# Patient Record
Sex: Male | Born: 1940
Health system: Southern US, Community
[De-identification: ages and names within clinical notes are randomized; demographics above are authoritative.]

## PROBLEM LIST (undated history)

## (undated) DIAGNOSIS — I359 Nonrheumatic aortic valve disorder, unspecified: Secondary | ICD-10-CM

## (undated) DIAGNOSIS — R112 Nausea with vomiting, unspecified: Secondary | ICD-10-CM

## (undated) DIAGNOSIS — M1990 Unspecified osteoarthritis, unspecified site: Secondary | ICD-10-CM

## (undated) DIAGNOSIS — Z87442 Personal history of urinary calculi: Secondary | ICD-10-CM

## (undated) DIAGNOSIS — Z9289 Personal history of other medical treatment: Secondary | ICD-10-CM

## (undated) DIAGNOSIS — E039 Hypothyroidism, unspecified: Secondary | ICD-10-CM

## (undated) DIAGNOSIS — R519 Headache, unspecified: Secondary | ICD-10-CM

## (undated) DIAGNOSIS — J449 Chronic obstructive pulmonary disease, unspecified: Secondary | ICD-10-CM

## (undated) DIAGNOSIS — K219 Gastro-esophageal reflux disease without esophagitis: Secondary | ICD-10-CM

## (undated) DIAGNOSIS — R011 Cardiac murmur, unspecified: Secondary | ICD-10-CM

## (undated) DIAGNOSIS — I10 Essential (primary) hypertension: Secondary | ICD-10-CM

## (undated) DIAGNOSIS — R51 Headache: Secondary | ICD-10-CM

## (undated) DIAGNOSIS — N400 Enlarged prostate without lower urinary tract symptoms: Secondary | ICD-10-CM

## (undated) DIAGNOSIS — Z9889 Other specified postprocedural states: Secondary | ICD-10-CM

## (undated) DIAGNOSIS — J189 Pneumonia, unspecified organism: Secondary | ICD-10-CM

## (undated) DIAGNOSIS — R0600 Dyspnea, unspecified: Secondary | ICD-10-CM

## (undated) DIAGNOSIS — G4730 Sleep apnea, unspecified: Secondary | ICD-10-CM

## (undated) HISTORY — PX: JOINT REPLACEMENT: SHX530

## (undated) HISTORY — PX: TONSILLECTOMY: SUR1361

## (undated) HISTORY — PX: FRACTURE SURGERY: SHX138

## (undated) HISTORY — DX: Benign prostatic hyperplasia without lower urinary tract symptoms: N40.0

## (undated) HISTORY — PX: BACK SURGERY: SHX140

## (undated) HISTORY — DX: Hypothyroidism, unspecified: E03.9

## (undated) HISTORY — DX: Dyspnea, unspecified: R06.00

## (undated) HISTORY — DX: Chronic obstructive pulmonary disease, unspecified: J44.9

---
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
## 2010-09-23 ENCOUNTER — Ambulatory Visit: Payer: Self-pay | Admitting: Cardiovascular Disease

## 2010-09-27 ENCOUNTER — Telehealth: Payer: Self-pay | Admitting: Cardiovascular Disease

## 2011-01-14 NOTE — Progress Notes (Signed)
  Phone Note Outgoing Call   Call placed by: Dessie Coma  LPN,  September 27, 2010 9:45 AM Call placed to: Patient Summary of Call: LMVM-notified patient per Dr. Crista Luria was normal.

## 2011-01-14 NOTE — Progress Notes (Signed)
  Phone Note Call from Patient   Caller: Patient Summary of Call: T.C. from patient-since Lexiscan normal, does not feel the need to f/u with Dr. Kirke Corin.  Will f/u with PCP. Initial call taken by: Dessie Coma  LPN,  September 27, 2010 10:15 AM

## 2011-03-27 ENCOUNTER — Encounter: Payer: Self-pay | Admitting: Internal Medicine

## 2011-03-27 ENCOUNTER — Ambulatory Visit (INDEPENDENT_AMBULATORY_CARE_PROVIDER_SITE_OTHER): Payer: Medicare PPO | Admitting: Internal Medicine

## 2011-03-27 VITALS — BP 110/76 | HR 80 | Temp 98.3°F | Ht 70.0 in | Wt 208.0 lb

## 2011-03-27 DIAGNOSIS — J449 Chronic obstructive pulmonary disease, unspecified: Secondary | ICD-10-CM

## 2011-03-27 NOTE — Patient Instructions (Addendum)
Only use dulera 2 puffs every 12 hours if needed for short of breath - work on technique   Omeprazole 20 mg Take 30-60 min before first meal of the day and Pepcid ac 20 mg at bedtime  GERD (REFLUX)  is an extremely common cause of respiratory symptoms, many times with no significant heartburn at all.    It can be treated with medication, but also with lifestyle changes including avoidance of late meals, excessive alcohol, smoking cessation, and avoid fatty foods, chocolate, peppermint, colas, red wine, and acidic juices such as orange juice.  NO MINT OR MENTHOL PRODUCTS SO NO COUGH DROPS  USE SUGARLESS CANDY INSTEAD (jolley ranchers or Stover's)  NO OIL BASED VITAMINS   Please schedule a follow up office visit in 4 weeks, sooner if needed for PFT's only bring active medications

## 2011-03-27 NOTE — Progress Notes (Signed)
  Subjective:    Patient ID: Jeffrey Mahoney, male    DOB: 11-Aug-1941, 70 y.o.   MRN: [id number]  HPI  33 yowm quit smoking age 55 with no resp symptoms at all and  no need for respiratory meds referred to pulmonary clinic by Dr Marina Goodell for eval of difficult to manage airway symptoms? Etiology.  03/27/2011   Initial pulmonary office eval for gradual onset discomfort fluttering  In chest and a little breathing with hills with neg cardiac w/u in [place] but persisted x 2 months then 2 weeks prior to ov started felt like coming down with a cold with runny nose, scratchy throat, and cough/ sob  p exposure to sick family in [place] > in ER.   rx with prednisone and levaquin main symptom is fatigue,  Sleeping ok without resp flare cough and on but not      Review of Systems  Constitutional: Negative for fever, chills, activity change, appetite change and unexpected weight change.  HENT: Negative for congestion, sore throat, rhinorrhea, sneezing, trouble swallowing, dental problem, voice change and postnasal drip.   Eyes: Negative for visual disturbance.  Respiratory: Positive for cough and shortness of breath. Negative for choking.   Cardiovascular: Negative for chest pain and leg swelling.  Gastrointestinal: Negative for nausea, vomiting and abdominal pain.  Genitourinary: Negative for difficulty urinating.  Musculoskeletal: Negative for arthralgias.  Skin: Negative for rash.  Psychiatric/Behavioral: Negative for behavioral problems and confusion.       Objective:   Physical Exam    amb wm who  failed to answer a single question asked in a straightforward manner, tending to go off on tangents or answer questions with ambiguous medical terms or diagnoses and seemed frankly perplexed   when asked the same question more than once for clarification.  Wt 208 03/27/11  HEENT mild turbinate edema.  Oropharynx no thrush or excess pnd or cobblestoning.  No JVD or cervical adenopathy. Mild accessory muscle  hypertrophy. Trachea midline, nl thryroid. Chest was hyperinflated by percussion with diminished breath sounds and moderate increased exp time without wheeze. Hoover sign positive at mid inspiration. Regular rate and rhythm without murmur gallop or rub or increase P2 or edema.  Abd: no hsm, nl excursion. Ext warm without cyanosis or clubbing.     cxr 03/13/11 cxr ok Assessment & Plan:

## 2011-03-28 NOTE — Assessment & Plan Note (Addendum)
  When respiratory symptoms begin well after a patient reports complete smoking cessation,  it is very hard to "blame" COPD specifically or airways disorders in general  ie it doesn't make any more sense than hearing a  NASC[employer] driver wrecked his car while driving his kids to school or a surgeon sliced his hand off carving roast beef (it must be rare indeed!)     That is to say, once the high risk activity stops,  the symptoms should not suddenly erupt or markedly worsen.  If so, the differential diagnosis should include  obesity/deconditioning,  LPR/Reflux/Aspiration syndromes,  occult CHF, or  especially side effect of medications commonly used in this population.    In this case strongly suspect occult reflux.  Explained natural history of uri and why it's necessary in patients at risk to treat GERD aggressively  at least  short term   to reduce risk of evolving cyclical cough initially  triggered by epithelial injury and a heightened sensitivty to the effects of any upper airway irritants,  most importantly acid - related.  That is, the more sensitive the epithelium damaged for virus, the more the cough, the more the secondary reflux (especially in those prone to reflux) the more the irritation of the sensitive mucosa and so on in a cyclical pattern.   See instructions for specific recommendations which were reviewed directly with the patient who was given a copy with highlighter outlining the key components.   The proper method of use, as well as anticipated side effects, of this metered-dose inhaler are discussed and demonstrated to the patient.  He only achieved 50% effectiveness but probably doesn't really need inhalers at this point anyway

## 2011-04-07 ENCOUNTER — Telehealth: Payer: Self-pay | Admitting: Internal Medicine

## 2011-04-07 MED ORDER — OMEPRAZOLE 20 MG PO CPDR: DELAYED_RELEASE_CAPSULE | ORAL | Status: DC

## 2011-04-07 NOTE — Telephone Encounter (Signed)
Spoke with pt to verify msg. Rx for omeprazole was sent to pharm.

## 2011-04-29 NOTE — Letter (Signed)
September 23, 2010    Brent Bulla, MD  [address]  [place], [place]  [address]   RE:  Jeffrey Mahoney, Jeffrey Mahoney  MRN:  [id number]  /  DOB:  06-06-1941   Dear Dr. Marina Goodell:   Thank you for referring Jeffrey Mahoney for further cardiac evaluation.  As you  are aware, this is a pleasant 70 year old gentleman with the following  problem list:  1. Hypothyroidism.  2. Benign prostatic hypertrophy.  3. Ankle injury since he was a child.   Jeffrey Mahoney is here today for evaluation of chest pain and dyspnea.  He has  no previous cardiac history.  He tells me that he received a flu shot  about a week ago.  After that he started having some runny nose  associated with dyspnea.  He started having discomfort in his back which  is worse with cough and deep breath as well as with sneezing.  He had no  chest pain but noticed that he is getting out of breath easier with  activities.  There have been no palpitations, dizziness, syncope or  presyncope.  The discomfort in his back is aggravated by certain  movements.  It can happen at rest most of the time.  It does not seem to  be related to activities.  He went today for evaluation and had an EKG  done which was slightly abnormal with some minimal ST depression in the  inferior as well as V5 and V6.  Thus he was sent for urgent evaluation.  He is currently not having any discomfort.   PAST MEDICAL HISTORY:  Is as outlined above.   MEDICATIONS:  1. Avodart 50 mg once daily.  2. Synthroid 112 mcg once daily.  3. Vitamin E once daily.  4. Vitamin B6.   ALLERGIES:  No known drug allergies.   SOCIAL HISTORY:  He quit smoking in 1989.  He drinks 1 or 2 beers 2or 3  times a week.  He does not exercise regularly.  He works as an  Personnel officer.   FAMILY HISTORY:  His uncle died at the age of 55 from myocardial  infarction.  His mother died of stroke complications in her 11s but had  her first stroke when she was 85.   REVIEW OF SYSTEMS:  This is remarkable for dyspnea,  allergies,  arthritis, back discomfort as described above.  A full review of systems  was performed and is otherwise negative.   PHYSICAL EXAMINATION:  GENERAL:  The patient is pleasant and appears to  be in no acute distress.  VITAL SIGNS:  Weight is 202 pounds, blood pressure is 118/75, pulse is  75, oxygen saturation is 96% on room air.  HEENT:  Normocephalic, atraumatic.  NECK:  No JVD or carotid bruits.  RESPIRATORY:  Normal respiratory effort with no use of accessory  muscles.  Auscultation reveals normal breath sounds.  CARDIOVASCULAR:  Normal PMI.  Normal S1 and S2 with no gallops.  There  is a 1/6 systolic ejection murmur at the aortic area which seems to be  benign.  ABDOMEN:  Benign, nontender, nondistended.  EXTREMITIES:  With no clubbing, cyanosis or edema.  SKIN:  Warm and dry with no rash.  PSYCHIATRIC:  He is alert, oriented x3.  He seems to be slightly  anxious.  MUSCULOSKELETAL:  There is chronic ankle injury noted.  He has normal  muscle strength in the upper and lower extremities.   An electrocardiogram was done which showed normal sinus rhythm  with  sinus arrhythmia.  There were some ST inferolateral changes but the  current EKG appears to be nonspecific.  His other ECG from Urgent Care  showed minimal ST depression in the inferior as well as V5-V6 which is  less than 1/2 mm.  His labs in July were overall unremarkable.  Chest x-  ray showed no acute process.   IMPRESSION:  Atypical back discomfort associated with dyspnea:  A  cardiac etiology will need to be ruled out.  His electrocardiogram is  borderline abnormal.  The quality of discomfort in his back seems to be  pleuritic in nature.  Chest x-ray showed no acute process.  He is  complaining though of increased exertional dyspnea which is concerning.  Given his symptoms and his new finding in his electrocardiogram, I  recommend urgent evaluation with a stress test.  He is unable to  exercise on a  treadmill due to his ankle injury.  Thus we will schedule  him for a Lexiscan nuclear stress test.  In the meanwhile, I asked him  to cut down on his physical activities until we get the results of his  stress test.  I asked him also to start aspirin 81 mg once daily.  He  will follow up after the stress test.  Thank you for allowing me to  participate in the care of your patient.    Sincerely,      Lorine Bears, MD  Electronically Signed    MA/MedQ  DD: 09/23/2010  DT: 09/23/2010  Job #: [id number]

## 2011-04-30 ENCOUNTER — Encounter: Payer: Self-pay | Admitting: Internal Medicine

## 2011-04-30 ENCOUNTER — Ambulatory Visit (INDEPENDENT_AMBULATORY_CARE_PROVIDER_SITE_OTHER): Payer: Medicare PPO | Admitting: Internal Medicine

## 2011-04-30 VITALS — BP 118/76 | HR 81 | Temp 97.8°F | Ht 70.0 in | Wt 209.0 lb

## 2011-04-30 DIAGNOSIS — J449 Chronic obstructive pulmonary disease, unspecified: Secondary | ICD-10-CM

## 2011-04-30 LAB — PULMONARY FUNCTION TEST

## 2011-04-30 NOTE — Progress Notes (Signed)
PFT done today. 

## 2011-04-30 NOTE — Progress Notes (Signed)
   Subjective:    Patient ID: Jeffrey Mahoney, male    DOB: [birth year], 70 y.o.   MRN: [id number]  HPI  70 yowm quit smoking age 55 with no resp symptoms at all and  no need for respiratory meds referred to pulmonary clinic by Dr Marina Goodell for eval of difficult to manage airway symptoms? Etiology.  03/27/2011   Initial pulmonary office eval for gradual onset discomfort fluttering  In chest and a little breathing with hills with neg cardiac w/u in [place] but persisted x 2 months then 2 weeks prior to ov started felt like coming down with a cold with runny nose, scratchy throat, and cough/ sob  p exposure to sick family in [place] > in ER.   rx with prednisone and levaquin main symptom is fatigue,  Sleeping ok without resp flare cough.   Only use dulera 2 puffs every 12 hours if needed for short of breath - work on technique  > did not feel it helped or that he needed it.  Omeprazole 20 mg Take 30-60 min before first meal of the day and Pepcid ac 20 mg at bedtime  GERD (REFLUX) diet  04/30/2011 ov/[name] cc breathing better overall but not using any dulera because not limited by breathing.  No cough  Pt denies any significant sore throat, dysphagia, itching, sneezing,  nasal congestion or excess/ purulent secretions,  fever, chills, sweats, unintended wt loss, pleuritic or exertional cp, hempoptysis, orthopnea pnd or leg swelling.    Also denies any obvious fluctuation of symptoms with weather or environmental changes or other aggravating or alleviating factors.       .        Objective:   Physical Exam    amb wm who again  failed to answer a single question asked in a straightforward manner, tending to go off on tangents or answer questions with ambiguous medical terms or diagnoses and seemed frankly perplexed   when asked the same question more than once for clarification.  Wt 208 03/27/11  > 209 04/30/2011   HEENT mild turbinate edema.  Oropharynx no thrush or excess pnd or cobblestoning.  No JVD  or cervical adenopathy. Mild accessory muscle hypertrophy. Trachea midline, nl thryroid. Chest was hyperinflated by percussion with diminished breath sounds and moderate increased exp time without wheeze. Hoover sign positive at mid inspiration. Regular rate and rhythm without murmur gallop or rub or increase P2 or edema.  Abd: no hsm, nl excursion. Ext warm without cyanosis or clubbing.       Assessment & Plan:

## 2011-04-30 NOTE — Assessment & Plan Note (Signed)
I had an extended discussion with the patient today lasting 15 to 20 minutes of a 25 minute visit on the following issues:  Although he has GOLD III copd and does desaturate p 3 laps, he is very sedentary and unlikely to be motivated to take any medication for this . \    As I explained to this patient in detail:  although there may be significant copd present, it does not appear to be limiting activity tolerance any more than a set of worn tires limits someone from driving a car  around a parking lot.  A new set of Michelins might look good but would have no perceived impact on the performance of the car and would not be worth the cost.  That is to say:   this pt is so sedentary I don't recommend aggressive pulmonary rx at this point unless limiting symptoms arise or acute exacerbations become as issue, neither of which are the case now.  I asked the patient to contact this office at any time in the future should either of these problems arise.

## 2011-04-30 NOTE — Patient Instructions (Addendum)
You have moderate copd from smoking with a significant reversible component so you may be short of breath with exertion and prone to flares with upper tract infections.  The best choice for now is to take advantage of the dulera, learn how to use it, and see if you are satisfied.  If not the option is advair 250/50 one twice daily   If you are satisfied with your treatment plan let your doctor know and he/she can either refill your medications or you can return here when your prescription runs out.     If in any way you are not 100% satisfied,  please tell us.  If 100% better, tell your friends!

## 2011-05-01 ENCOUNTER — Telehealth: Payer: Self-pay | Admitting: Internal Medicine

## 2011-05-01 NOTE — Telephone Encounter (Signed)
PFT'S have been mailed to address giving

## 2011-05-01 NOTE — Telephone Encounter (Signed)
Ok to mail pfts

## 2011-05-01 NOTE — Telephone Encounter (Signed)
MW, pt would like a copy of his breathing test.  Ok to send this to the pt?  Please advise. thanks

## 2011-05-16 ENCOUNTER — Encounter: Payer: Self-pay | Admitting: Internal Medicine

## 2011-09-07 ENCOUNTER — Encounter: Payer: Self-pay | Admitting: Cardiovascular Disease

## 2011-09-30 ENCOUNTER — Encounter: Payer: Self-pay | Admitting: Cardiovascular Disease

## 2011-10-25 ENCOUNTER — Other Ambulatory Visit: Payer: Self-pay | Admitting: Internal Medicine

## 2011-12-16 HISTORY — PX: FIXATION KYPHOPLASTY LUMBAR SPINE: SHX1642

## 2015-05-08 DIAGNOSIS — J439 Emphysema, unspecified: Secondary | ICD-10-CM | POA: Insufficient documentation

## 2015-05-08 DIAGNOSIS — I712 Thoracic aortic aneurysm, without rupture: Secondary | ICD-10-CM | POA: Insufficient documentation

## 2015-05-08 DIAGNOSIS — I7121 Aneurysm of the ascending aorta, without rupture: Secondary | ICD-10-CM | POA: Insufficient documentation

## 2015-05-08 DIAGNOSIS — R9439 Abnormal result of other cardiovascular function study: Secondary | ICD-10-CM

## 2015-05-08 DIAGNOSIS — R079 Chest pain, unspecified: Secondary | ICD-10-CM

## 2015-05-08 HISTORY — DX: Thoracic aortic aneurysm, without rupture: I71.2

## 2015-05-08 HISTORY — DX: Chest pain, unspecified: R07.9

## 2015-05-08 HISTORY — DX: Abnormal result of other cardiovascular function study: R94.39

## 2015-05-08 HISTORY — DX: Emphysema, unspecified: J43.9

## 2015-05-08 HISTORY — DX: Aneurysm of the ascending aorta, without rupture: I71.21

## 2016-01-02 DIAGNOSIS — G4733 Obstructive sleep apnea (adult) (pediatric): Secondary | ICD-10-CM | POA: Diagnosis not present

## 2016-05-05 DIAGNOSIS — J449 Chronic obstructive pulmonary disease, unspecified: Secondary | ICD-10-CM | POA: Diagnosis not present

## 2016-05-05 DIAGNOSIS — R5383 Other fatigue: Secondary | ICD-10-CM | POA: Diagnosis not present

## 2016-05-05 DIAGNOSIS — R918 Other nonspecific abnormal finding of lung field: Secondary | ICD-10-CM | POA: Diagnosis not present

## 2016-05-05 DIAGNOSIS — G4733 Obstructive sleep apnea (adult) (pediatric): Secondary | ICD-10-CM | POA: Diagnosis not present

## 2016-05-14 DIAGNOSIS — L603 Nail dystrophy: Secondary | ICD-10-CM | POA: Insufficient documentation

## 2016-05-14 DIAGNOSIS — M21952 Unspecified acquired deformity of left thigh: Secondary | ICD-10-CM

## 2016-05-14 DIAGNOSIS — L84 Corns and callosities: Secondary | ICD-10-CM | POA: Insufficient documentation

## 2016-05-14 DIAGNOSIS — M21959 Unspecified acquired deformity of unspecified thigh: Secondary | ICD-10-CM | POA: Diagnosis not present

## 2016-05-14 HISTORY — DX: Unspecified acquired deformity of left thigh: M21.952

## 2016-05-14 HISTORY — DX: Corns and callosities: L84

## 2016-05-14 HISTORY — DX: Nail dystrophy: L60.3

## 2016-05-19 DIAGNOSIS — J449 Chronic obstructive pulmonary disease, unspecified: Secondary | ICD-10-CM | POA: Diagnosis not present

## 2016-05-19 DIAGNOSIS — G4733 Obstructive sleep apnea (adult) (pediatric): Secondary | ICD-10-CM | POA: Diagnosis not present

## 2016-05-19 DIAGNOSIS — R918 Other nonspecific abnormal finding of lung field: Secondary | ICD-10-CM | POA: Diagnosis not present

## 2016-05-19 DIAGNOSIS — G2581 Restless legs syndrome: Secondary | ICD-10-CM | POA: Diagnosis not present

## 2016-05-21 DIAGNOSIS — D225 Melanocytic nevi of trunk: Secondary | ICD-10-CM | POA: Diagnosis not present

## 2016-05-21 DIAGNOSIS — D2239 Melanocytic nevi of other parts of face: Secondary | ICD-10-CM | POA: Diagnosis not present

## 2016-05-21 DIAGNOSIS — L814 Other melanin hyperpigmentation: Secondary | ICD-10-CM | POA: Diagnosis not present

## 2016-05-21 DIAGNOSIS — D485 Neoplasm of uncertain behavior of skin: Secondary | ICD-10-CM | POA: Diagnosis not present

## 2016-06-03 DIAGNOSIS — R5383 Other fatigue: Secondary | ICD-10-CM | POA: Diagnosis not present

## 2016-06-03 DIAGNOSIS — I10 Essential (primary) hypertension: Secondary | ICD-10-CM | POA: Diagnosis not present

## 2016-06-03 DIAGNOSIS — Z125 Encounter for screening for malignant neoplasm of prostate: Secondary | ICD-10-CM | POA: Diagnosis not present

## 2016-06-03 DIAGNOSIS — Z6830 Body mass index (BMI) 30.0-30.9, adult: Secondary | ICD-10-CM | POA: Diagnosis not present

## 2016-06-03 DIAGNOSIS — J449 Chronic obstructive pulmonary disease, unspecified: Secondary | ICD-10-CM | POA: Diagnosis not present

## 2016-06-03 DIAGNOSIS — E039 Hypothyroidism, unspecified: Secondary | ICD-10-CM | POA: Diagnosis not present

## 2016-06-03 DIAGNOSIS — L259 Unspecified contact dermatitis, unspecified cause: Secondary | ICD-10-CM | POA: Diagnosis not present

## 2016-06-03 DIAGNOSIS — S90862A Insect bite (nonvenomous), left foot, initial encounter: Secondary | ICD-10-CM | POA: Diagnosis not present

## 2016-06-03 DIAGNOSIS — S90869A Insect bite (nonvenomous), unspecified foot, initial encounter: Secondary | ICD-10-CM | POA: Diagnosis not present

## 2016-06-05 DIAGNOSIS — M2170 Unequal limb length (acquired), unspecified site: Secondary | ICD-10-CM | POA: Insufficient documentation

## 2016-06-05 DIAGNOSIS — M85872 Other specified disorders of bone density and structure, left ankle and foot: Secondary | ICD-10-CM | POA: Diagnosis not present

## 2016-06-05 DIAGNOSIS — M21072 Valgus deformity, not elsewhere classified, left ankle: Secondary | ICD-10-CM | POA: Insufficient documentation

## 2016-06-05 DIAGNOSIS — Z87891 Personal history of nicotine dependence: Secondary | ICD-10-CM | POA: Diagnosis not present

## 2016-06-05 DIAGNOSIS — M21172 Varus deformity, not elsewhere classified, left ankle: Secondary | ICD-10-CM | POA: Diagnosis not present

## 2016-06-05 DIAGNOSIS — Z79899 Other long term (current) drug therapy: Secondary | ICD-10-CM | POA: Diagnosis not present

## 2016-06-05 DIAGNOSIS — M778 Other enthesopathies, not elsewhere classified: Secondary | ICD-10-CM | POA: Diagnosis not present

## 2016-06-05 DIAGNOSIS — M7752 Other enthesopathy of left foot: Secondary | ICD-10-CM | POA: Diagnosis not present

## 2016-06-05 DIAGNOSIS — E039 Hypothyroidism, unspecified: Secondary | ICD-10-CM | POA: Diagnosis not present

## 2016-06-05 DIAGNOSIS — J439 Emphysema, unspecified: Secondary | ICD-10-CM | POA: Diagnosis not present

## 2016-06-05 DIAGNOSIS — M21962 Unspecified acquired deformity of left lower leg: Secondary | ICD-10-CM | POA: Diagnosis not present

## 2016-06-05 HISTORY — DX: Valgus deformity, not elsewhere classified, left ankle: M21.072

## 2016-06-05 HISTORY — DX: Unequal limb length (acquired), unspecified site: M21.70

## 2016-06-11 DIAGNOSIS — S52501A Unspecified fracture of the lower end of right radius, initial encounter for closed fracture: Secondary | ICD-10-CM | POA: Diagnosis not present

## 2016-07-23 DIAGNOSIS — S52509A Unspecified fracture of the lower end of unspecified radius, initial encounter for closed fracture: Secondary | ICD-10-CM | POA: Diagnosis not present

## 2016-07-23 DIAGNOSIS — S52501A Unspecified fracture of the lower end of right radius, initial encounter for closed fracture: Secondary | ICD-10-CM | POA: Diagnosis not present

## 2016-07-30 DIAGNOSIS — L219 Seborrheic dermatitis, unspecified: Secondary | ICD-10-CM | POA: Diagnosis not present

## 2016-08-08 DIAGNOSIS — H2513 Age-related nuclear cataract, bilateral: Secondary | ICD-10-CM | POA: Diagnosis not present

## 2016-08-20 DIAGNOSIS — S52501A Unspecified fracture of the lower end of right radius, initial encounter for closed fracture: Secondary | ICD-10-CM | POA: Diagnosis not present

## 2016-08-22 DIAGNOSIS — M25531 Pain in right wrist: Secondary | ICD-10-CM | POA: Diagnosis not present

## 2016-08-22 DIAGNOSIS — M25631 Stiffness of right wrist, not elsewhere classified: Secondary | ICD-10-CM | POA: Diagnosis not present

## 2016-08-26 DIAGNOSIS — M25531 Pain in right wrist: Secondary | ICD-10-CM | POA: Diagnosis not present

## 2016-08-26 DIAGNOSIS — M25631 Stiffness of right wrist, not elsewhere classified: Secondary | ICD-10-CM | POA: Diagnosis not present

## 2016-08-28 DIAGNOSIS — M25531 Pain in right wrist: Secondary | ICD-10-CM | POA: Diagnosis not present

## 2016-08-28 DIAGNOSIS — M25631 Stiffness of right wrist, not elsewhere classified: Secondary | ICD-10-CM | POA: Diagnosis not present

## 2016-09-01 DIAGNOSIS — G4733 Obstructive sleep apnea (adult) (pediatric): Secondary | ICD-10-CM | POA: Diagnosis not present

## 2016-09-01 DIAGNOSIS — M25631 Stiffness of right wrist, not elsewhere classified: Secondary | ICD-10-CM | POA: Diagnosis not present

## 2016-09-01 DIAGNOSIS — J449 Chronic obstructive pulmonary disease, unspecified: Secondary | ICD-10-CM | POA: Diagnosis not present

## 2016-09-01 DIAGNOSIS — R5383 Other fatigue: Secondary | ICD-10-CM | POA: Diagnosis not present

## 2016-09-01 DIAGNOSIS — G2581 Restless legs syndrome: Secondary | ICD-10-CM | POA: Diagnosis not present

## 2016-09-01 DIAGNOSIS — R918 Other nonspecific abnormal finding of lung field: Secondary | ICD-10-CM | POA: Diagnosis not present

## 2016-09-01 DIAGNOSIS — M25531 Pain in right wrist: Secondary | ICD-10-CM | POA: Diagnosis not present

## 2016-09-04 DIAGNOSIS — M25631 Stiffness of right wrist, not elsewhere classified: Secondary | ICD-10-CM | POA: Diagnosis not present

## 2016-09-04 DIAGNOSIS — M25531 Pain in right wrist: Secondary | ICD-10-CM | POA: Diagnosis not present

## 2016-09-08 DIAGNOSIS — M25531 Pain in right wrist: Secondary | ICD-10-CM | POA: Diagnosis not present

## 2016-09-08 DIAGNOSIS — M25631 Stiffness of right wrist, not elsewhere classified: Secondary | ICD-10-CM | POA: Diagnosis not present

## 2016-09-10 DIAGNOSIS — J029 Acute pharyngitis, unspecified: Secondary | ICD-10-CM | POA: Diagnosis not present

## 2016-09-10 DIAGNOSIS — M25631 Stiffness of right wrist, not elsewhere classified: Secondary | ICD-10-CM | POA: Diagnosis not present

## 2016-09-10 DIAGNOSIS — M25531 Pain in right wrist: Secondary | ICD-10-CM | POA: Diagnosis not present

## 2016-09-15 DIAGNOSIS — Z0000 Encounter for general adult medical examination without abnormal findings: Secondary | ICD-10-CM | POA: Diagnosis not present

## 2016-09-15 DIAGNOSIS — E785 Hyperlipidemia, unspecified: Secondary | ICD-10-CM | POA: Diagnosis not present

## 2016-09-15 DIAGNOSIS — M25531 Pain in right wrist: Secondary | ICD-10-CM | POA: Diagnosis not present

## 2016-09-15 DIAGNOSIS — M25631 Stiffness of right wrist, not elsewhere classified: Secondary | ICD-10-CM | POA: Diagnosis not present

## 2016-09-18 DIAGNOSIS — M25631 Stiffness of right wrist, not elsewhere classified: Secondary | ICD-10-CM | POA: Diagnosis not present

## 2016-09-18 DIAGNOSIS — M25531 Pain in right wrist: Secondary | ICD-10-CM | POA: Diagnosis not present

## 2016-09-22 DIAGNOSIS — M25531 Pain in right wrist: Secondary | ICD-10-CM | POA: Diagnosis not present

## 2016-09-22 DIAGNOSIS — M25631 Stiffness of right wrist, not elsewhere classified: Secondary | ICD-10-CM | POA: Diagnosis not present

## 2016-09-23 DIAGNOSIS — J449 Chronic obstructive pulmonary disease, unspecified: Secondary | ICD-10-CM | POA: Diagnosis not present

## 2016-09-23 DIAGNOSIS — G4733 Obstructive sleep apnea (adult) (pediatric): Secondary | ICD-10-CM | POA: Diagnosis not present

## 2016-09-23 DIAGNOSIS — G2581 Restless legs syndrome: Secondary | ICD-10-CM | POA: Diagnosis not present

## 2016-09-23 DIAGNOSIS — R5383 Other fatigue: Secondary | ICD-10-CM | POA: Diagnosis not present

## 2016-09-25 DIAGNOSIS — M25631 Stiffness of right wrist, not elsewhere classified: Secondary | ICD-10-CM | POA: Diagnosis not present

## 2016-09-25 DIAGNOSIS — M25531 Pain in right wrist: Secondary | ICD-10-CM | POA: Diagnosis not present

## 2016-09-29 DIAGNOSIS — M25631 Stiffness of right wrist, not elsewhere classified: Secondary | ICD-10-CM | POA: Diagnosis not present

## 2016-09-29 DIAGNOSIS — M25531 Pain in right wrist: Secondary | ICD-10-CM | POA: Diagnosis not present

## 2016-10-01 DIAGNOSIS — S52501A Unspecified fracture of the lower end of right radius, initial encounter for closed fracture: Secondary | ICD-10-CM | POA: Diagnosis not present

## 2016-10-08 DIAGNOSIS — M25531 Pain in right wrist: Secondary | ICD-10-CM | POA: Diagnosis not present

## 2016-10-08 DIAGNOSIS — M25631 Stiffness of right wrist, not elsewhere classified: Secondary | ICD-10-CM | POA: Diagnosis not present

## 2016-10-24 DIAGNOSIS — M25631 Stiffness of right wrist, not elsewhere classified: Secondary | ICD-10-CM | POA: Diagnosis not present

## 2016-10-24 DIAGNOSIS — M25531 Pain in right wrist: Secondary | ICD-10-CM | POA: Diagnosis not present

## 2016-10-29 DIAGNOSIS — I712 Thoracic aortic aneurysm, without rupture: Secondary | ICD-10-CM | POA: Diagnosis not present

## 2016-10-29 DIAGNOSIS — R918 Other nonspecific abnormal finding of lung field: Secondary | ICD-10-CM | POA: Diagnosis not present

## 2016-10-29 DIAGNOSIS — J449 Chronic obstructive pulmonary disease, unspecified: Secondary | ICD-10-CM | POA: Diagnosis not present

## 2016-10-30 DIAGNOSIS — M25631 Stiffness of right wrist, not elsewhere classified: Secondary | ICD-10-CM | POA: Diagnosis not present

## 2016-10-30 DIAGNOSIS — M25531 Pain in right wrist: Secondary | ICD-10-CM | POA: Diagnosis not present

## 2016-11-05 DIAGNOSIS — M25631 Stiffness of right wrist, not elsewhere classified: Secondary | ICD-10-CM | POA: Diagnosis not present

## 2016-11-05 DIAGNOSIS — M25531 Pain in right wrist: Secondary | ICD-10-CM | POA: Diagnosis not present

## 2016-11-10 DIAGNOSIS — N401 Enlarged prostate with lower urinary tract symptoms: Secondary | ICD-10-CM | POA: Diagnosis not present

## 2016-11-10 DIAGNOSIS — R350 Frequency of micturition: Secondary | ICD-10-CM | POA: Diagnosis not present

## 2016-11-10 DIAGNOSIS — N5201 Erectile dysfunction due to arterial insufficiency: Secondary | ICD-10-CM | POA: Diagnosis not present

## 2016-11-13 DIAGNOSIS — M25531 Pain in right wrist: Secondary | ICD-10-CM | POA: Diagnosis not present

## 2016-11-13 DIAGNOSIS — M25631 Stiffness of right wrist, not elsewhere classified: Secondary | ICD-10-CM | POA: Diagnosis not present

## 2016-11-14 ENCOUNTER — Institutional Professional Consult (permissible substitution) (INDEPENDENT_AMBULATORY_CARE_PROVIDER_SITE_OTHER): Payer: PPO | Admitting: Cardiothoracic Surgery

## 2016-11-14 ENCOUNTER — Encounter: Payer: Self-pay | Admitting: Cardiothoracic Surgery

## 2016-11-14 VITALS — BP 130/87 | HR 88 | Resp 20 | Ht 70.0 in | Wt 197.0 lb

## 2016-11-14 DIAGNOSIS — I712 Thoracic aortic aneurysm, without rupture: Secondary | ICD-10-CM

## 2016-11-14 DIAGNOSIS — I7121 Aneurysm of the ascending aorta, without rupture: Secondary | ICD-10-CM

## 2016-11-14 NOTE — Progress Notes (Signed)
It

## 2016-11-14 NOTE — Progress Notes (Signed)
PCP is Lillard Anes, MD Referring Provider is Gardiner Rhyme, MD  Chief Complaint  Patient presents with  . Thoracic Aortic Aneurysm    Surgical eval, Chest CT 10/29/16, PFT's 09/01/16  Patient examined, CTA scans of thoracic aorta dating back to 2015 personally reviewed and counseled with patient and wife  HPI: 75 year old Caucasian male reformed smoker with COPD [FVC 2.9( 72%)  FEV1 1.85 (59%)  DLCO 44% ] and recently diagnosed with sleep apnea referred by Dr. Alcide Clever for evaluation and discussion of a known moderate ascending thoracic aortic fusiform aneurysm entering 4.7 cm, asymptomatic. The last CT scan shows no evidence of intramural hematoma or penetrating ulceration or heavy  aortic calcification. The aortic valve does have calcification. The patient's fusiform aneurysm is asymptomatic and CT scans have been performed since 2014 with a slight increase in diameter from 4.5-4.7 cm. There is no family history of thoracic or abdominal aneurysm disease or family history of sudden death or aortic dissection. The patient has mild hypertension treated with Toprol XL 12.5 mg daily.  The patient also has a mild systolic murmur as noted by Dr. Alcide Clever.  The patient denies any cardiac symptoms of angina, dyspnea on exertion, orthopnea, PND or ankle edema. The patient is in a sinus rhythm and takes aspirin daily.  Despite the diagnosis of sleep apnea the patient has declined recommendation for CPAP therapy. Dr. Alcide Clever has explained to the patient the risks of sleep apnea and the rationale for treatment with CPAP therapy.  Past Medical History:  Diagnosis Date  . Allergic rhinitis   . Ankle injury   . BPH (benign prostatic hypertrophy)   . Chest pain   . COPD (chronic obstructive pulmonary disease) ([place])   . Dyspnea   . Hypothyroidism   . Hypothyroidism     Past Surgical History:  Procedure Laterality Date  . ANKLE SURGERY     as a child  . SPINE SURGERY     lower back     Family History  Problem Relation Age of Onset  . Hyperlipidemia Mother   . Hypertension Mother   . Stroke Mother   . Leukemia Mother   . Cancer Father   . Heart disease Father   . Early death Father   . Cancer Sister   . Kidney disease Brother   . Heart disease Maternal Grandmother   . Kidney disease Maternal Grandmother   . Heart disease Maternal Grandfather     Social History Social History  Substance Use Topics  . Smoking status: Former Smoker    Packs/day: 1.00    Years: 20.00    Types: Cigarettes    Quit date: 12/16/1987  . Smokeless tobacco: Never Used  . Alcohol use Yes     Comment: occ ETOH    Current Outpatient Prescriptions  Medication Sig Dispense Refill  . aspirin 325 MG tablet Take 325 mg by mouth as needed.      . Aspirin-Acetaminophen-Caffeine (EXCEDRIN EXTRA STRENGTH PO) As directed as needed     . dutasteride (AVODART) 0.5 MG capsule Take 0.5 mg by mouth daily.      . famotidine (PEPCID) 20 MG tablet Take 20 mg by mouth at bedtime.      Marland Kitchen levothyroxine (SYNTHROID, LEVOTHROID) 112 MCG tablet Take 125 mcg by mouth daily.     . metoprolol tartrate (LOPRESSOR) 25 MG tablet Take 12.5 mg by mouth 2 (two) times daily.    . Mometasone Furo-Formoterol Fum (DULERA) 100-5 MCG/ACT AERO Inhale 2 puffs into  the lungs 2 (two) times daily.      Marland Kitchen omeprazole (PRILOSEC) 20 MG capsule TAKE 1 CAPSULE 30 TO 60 MINUTES BEFORE FIRST MEAL OF THE DAY 30 capsule 5  . Pyridoxine HCl (VITAMIN B-6 PO) Take by mouth.      Marland Kitchen rOPINIRole (REQUIP) 0.5 MG tablet Take 0.5 mg by mouth at bedtime as needed. Takes 2 tablets at bedtime/restless legs    . VITAMIN E PO Take by mouth daily.       No current facility-administered medications for this visit.     No Known Allergies  Review of Systems        Review of Systems :  [ y ] = yes, [  ] = no        General :  Weight gain [   ]    Weight loss  [   ]  Fatigue [  ]  Fever [  ]  Chills  [  ]                                Weakness  [   ]           HEENT    Headache [  ]  Dizziness [  ]  Blurred vision [  ] Glaucoma  [  ]                          Nosebleeds [  ] Painful or loose teeth [  ]        Cardiac :  Chest pain/ pressure [  ]  Resting SOB [  ] exertional SOB [ yes ]                        Orthopnea [  ]  Pedal edema  [  ]  Palpitations [  ] Syncope/presyncope [ ]                         Paroxysmal nocturnal dyspnea [  ]         Pulmonary : cough [place]  ]  wheezing [  ]  Hemoptysis [  ] Sputum [  ] Snoring [  ]                              Pneumothorax [  ]  Sleep apnea [place]  ]  No pulmonary masses or abnormal mediastinal adenopathy noted on CT scan of chest       GI : Vomiting [  ]  Dysphagia [  ]  Melena  [  ]  Abdominal pain [  ] BRBPR [  ]              Heart burn [  ]  Constipation [  ] Diarrhea  [  ] Colonoscopy [place]   ]        GU : Hematuria [  ]  Dysuria [  ]  Nocturia [  ] UTI's [  ]        Vascular : Claudication [  ]  Rest pain [  ]  DVT [  ] Vein stripping [  ] leg ulcers [  ]  TIA [  ] Stroke [  ]  Varicose veins [  ]        NEURO :  Headaches  [  ] Seizures [  ] Vision changes [  ] Paresthesias [  ]      right-hand-dominant                                 Seizures [  ]        Musculoskeletal :  Arthritis [place]  ] Gout  [  ]  Back pain [  ]  Joint pain [ yes ] severe problems with leg pain every night both legs from knees to ankles somewhat improved with Requip        Skin :  Rash [  ]  Melanoma [  ] Sores [  ]        Heme : Bleeding problems [  ]Clotting Disorders [  ] Anemia [  ]Blood Transfusion [ ]         Endocrine : Diabetes [  ] Heat or Cold intolerance [  ] Polyuria [  ]excessive thirst [ ]         Psych : Depression [  ]  Anxiety [  ]  Psych hospitalizations [  ] Memory change [  ]                                               BP 130/87   Pulse 88   Resp 20 Comment: RA  Ht 5\' 10"  (1.778 m)   Wt 197 lb (89.4 kg)   SpO2 93%   BMI 28.27 kg/m  Physical Exam      Physical Exam  General: Obese 75 year old Caucasian male no acute distress HEENT: Normocephalic pupils equal , dentition adequate Neck: Supple without JVD, adenopathy, or bruit Chest: Clear to auscultation, symmetrical breath sounds, no rhonchi, no tenderness             or deformity Cardiovascular: Regular rate and rhythm, 1-2/6 murmur of AS , no gallop, peripheral pulses             palpable in all extremities Abdomen:  Soft, nontender, no palpable mass or organomegaly Extremities: Warm, well-perfused, no clubbing cyanosis edema or tenderness, chronic ankle deformity of left ankle with swelling              no venous stasis changes of the legs Rectal/GU: Deferred Neuro: Grossly non--focal and symmetrical throughout Skin: Clean and dry without rash or ulceration  Diagnostic Tests: CT scans show no evidence of thoracic malignancy  CT scan show moderate fusiform ascending aneurysm which has increased in diameter slightly from 4.5-4.7cm  since 2014    Impression: Moderate, asymptomatic fusiform ascending aneurysm measuring 4.7 cm. Would not recommend surgery until the diameter reaches 5.5 cm more patient developed symptoms. Best treatment is continue blood pressure control, weight control and surveillance CTA scans  The patient has significant arthritis of the right knee and is being counseled for right total knee replacement. The patient's thoracic aortic disease would not be a contraindication to general anesthesia or knee replacement surgery.  The patient has a mild systolic murmur of probable aortic stenosis. Baseline echocardiogram is needed to evaluate cardiac ejection fraction and the aortic valve disease prior to major  surgery-knee replacement  Plan: The patient will return for CTA in 9 months.           The patient will be evaluated with transthoracic 2-D echocardiogram in the next 1-2 weeks. The patient understands that with a moderate ascending thoracic aneurysm he should  avoid heavy lifting or weight lifting at the fitness center. He understands the importance of blood pressure control and frequent blood pressure checks.   Len Childs, MD [facility] [phone]

## 2016-11-17 ENCOUNTER — Other Ambulatory Visit: Payer: Self-pay | Admitting: *Deleted

## 2016-11-17 DIAGNOSIS — R011 Cardiac murmur, unspecified: Secondary | ICD-10-CM

## 2016-11-17 DIAGNOSIS — I350 Nonrheumatic aortic (valve) stenosis: Secondary | ICD-10-CM

## 2016-11-17 DIAGNOSIS — I712 Thoracic aortic aneurysm, without rupture, unspecified: Secondary | ICD-10-CM

## 2016-11-19 DIAGNOSIS — L728 Other follicular cysts of the skin and subcutaneous tissue: Secondary | ICD-10-CM | POA: Diagnosis not present

## 2016-11-19 DIAGNOSIS — D485 Neoplasm of uncertain behavior of skin: Secondary | ICD-10-CM | POA: Diagnosis not present

## 2016-11-19 DIAGNOSIS — L814 Other melanin hyperpigmentation: Secondary | ICD-10-CM | POA: Diagnosis not present

## 2016-11-19 DIAGNOSIS — M25631 Stiffness of right wrist, not elsewhere classified: Secondary | ICD-10-CM | POA: Diagnosis not present

## 2016-11-19 DIAGNOSIS — M25531 Pain in right wrist: Secondary | ICD-10-CM | POA: Diagnosis not present

## 2016-11-19 DIAGNOSIS — L219 Seborrheic dermatitis, unspecified: Secondary | ICD-10-CM | POA: Diagnosis not present

## 2016-11-20 DIAGNOSIS — R011 Cardiac murmur, unspecified: Secondary | ICD-10-CM | POA: Diagnosis not present

## 2016-11-20 DIAGNOSIS — I350 Nonrheumatic aortic (valve) stenosis: Secondary | ICD-10-CM | POA: Diagnosis not present

## 2016-11-20 DIAGNOSIS — I712 Thoracic aortic aneurysm, without rupture: Secondary | ICD-10-CM | POA: Diagnosis not present

## 2016-11-27 ENCOUNTER — Encounter: Payer: Self-pay | Admitting: Cardiothoracic Surgery

## 2016-11-28 ENCOUNTER — Other Ambulatory Visit (HOSPITAL_COMMUNITY): Payer: PPO

## 2016-12-09 ENCOUNTER — Other Ambulatory Visit (HOSPITAL_COMMUNITY): Payer: PPO

## 2016-12-17 LAB — PULMONARY FUNCTION TEST

## 2017-01-28 DIAGNOSIS — M222X1 Patellofemoral disorders, right knee: Secondary | ICD-10-CM | POA: Diagnosis not present

## 2017-01-28 DIAGNOSIS — M94261 Chondromalacia, right knee: Secondary | ICD-10-CM | POA: Diagnosis not present

## 2017-01-28 DIAGNOSIS — M109 Gout, unspecified: Secondary | ICD-10-CM | POA: Diagnosis not present

## 2017-01-28 DIAGNOSIS — M1711 Unilateral primary osteoarthritis, right knee: Secondary | ICD-10-CM | POA: Diagnosis not present

## 2017-01-28 DIAGNOSIS — M25561 Pain in right knee: Secondary | ICD-10-CM | POA: Diagnosis not present

## 2017-01-28 DIAGNOSIS — M2241 Chondromalacia patellae, right knee: Secondary | ICD-10-CM | POA: Diagnosis not present

## 2017-02-18 ENCOUNTER — Encounter: Payer: Self-pay | Admitting: *Deleted

## 2017-04-29 DIAGNOSIS — D2239 Melanocytic nevi of other parts of face: Secondary | ICD-10-CM | POA: Diagnosis not present

## 2017-04-29 DIAGNOSIS — D225 Melanocytic nevi of trunk: Secondary | ICD-10-CM | POA: Diagnosis not present

## 2017-04-29 DIAGNOSIS — L720 Epidermal cyst: Secondary | ICD-10-CM | POA: Diagnosis not present

## 2017-04-29 DIAGNOSIS — D485 Neoplasm of uncertain behavior of skin: Secondary | ICD-10-CM | POA: Diagnosis not present

## 2017-04-29 DIAGNOSIS — L219 Seborrheic dermatitis, unspecified: Secondary | ICD-10-CM | POA: Diagnosis not present

## 2017-05-05 DIAGNOSIS — M546 Pain in thoracic spine: Secondary | ICD-10-CM | POA: Diagnosis not present

## 2017-05-20 DIAGNOSIS — G4733 Obstructive sleep apnea (adult) (pediatric): Secondary | ICD-10-CM | POA: Diagnosis not present

## 2017-05-20 DIAGNOSIS — R5383 Other fatigue: Secondary | ICD-10-CM | POA: Diagnosis not present

## 2017-05-20 DIAGNOSIS — J449 Chronic obstructive pulmonary disease, unspecified: Secondary | ICD-10-CM | POA: Diagnosis not present

## 2017-05-20 DIAGNOSIS — I719 Aortic aneurysm of unspecified site, without rupture: Secondary | ICD-10-CM | POA: Diagnosis not present

## 2017-05-20 DIAGNOSIS — G2581 Restless legs syndrome: Secondary | ICD-10-CM | POA: Diagnosis not present

## 2017-05-27 DIAGNOSIS — M1990 Unspecified osteoarthritis, unspecified site: Secondary | ICD-10-CM | POA: Insufficient documentation

## 2017-05-27 DIAGNOSIS — I493 Ventricular premature depolarization: Secondary | ICD-10-CM | POA: Insufficient documentation

## 2017-05-27 DIAGNOSIS — K219 Gastro-esophageal reflux disease without esophagitis: Secondary | ICD-10-CM | POA: Insufficient documentation

## 2017-05-27 DIAGNOSIS — E559 Vitamin D deficiency, unspecified: Secondary | ICD-10-CM | POA: Diagnosis not present

## 2017-05-27 DIAGNOSIS — J439 Emphysema, unspecified: Secondary | ICD-10-CM | POA: Diagnosis not present

## 2017-05-27 DIAGNOSIS — E039 Hypothyroidism, unspecified: Secondary | ICD-10-CM | POA: Insufficient documentation

## 2017-05-27 DIAGNOSIS — M159 Polyosteoarthritis, unspecified: Secondary | ICD-10-CM | POA: Diagnosis not present

## 2017-05-27 DIAGNOSIS — I712 Thoracic aortic aneurysm, without rupture: Secondary | ICD-10-CM | POA: Diagnosis not present

## 2017-05-27 HISTORY — DX: Gastro-esophageal reflux disease without esophagitis: K21.9

## 2017-05-27 HISTORY — DX: Unspecified osteoarthritis, unspecified site: M19.90

## 2017-05-27 HISTORY — DX: Ventricular premature depolarization: I49.3

## 2017-06-10 DIAGNOSIS — K219 Gastro-esophageal reflux disease without esophagitis: Secondary | ICD-10-CM | POA: Diagnosis not present

## 2017-06-10 DIAGNOSIS — I499 Cardiac arrhythmia, unspecified: Secondary | ICD-10-CM | POA: Diagnosis not present

## 2017-06-10 DIAGNOSIS — I712 Thoracic aortic aneurysm, without rupture: Secondary | ICD-10-CM | POA: Diagnosis not present

## 2017-06-10 DIAGNOSIS — I493 Ventricular premature depolarization: Secondary | ICD-10-CM | POA: Diagnosis not present

## 2017-06-10 DIAGNOSIS — M159 Polyosteoarthritis, unspecified: Secondary | ICD-10-CM | POA: Diagnosis not present

## 2017-06-10 DIAGNOSIS — J439 Emphysema, unspecified: Secondary | ICD-10-CM | POA: Diagnosis not present

## 2017-06-10 DIAGNOSIS — E039 Hypothyroidism, unspecified: Secondary | ICD-10-CM | POA: Diagnosis not present

## 2017-06-23 DIAGNOSIS — M1711 Unilateral primary osteoarthritis, right knee: Secondary | ICD-10-CM | POA: Diagnosis not present

## 2017-06-23 DIAGNOSIS — M546 Pain in thoracic spine: Secondary | ICD-10-CM | POA: Diagnosis not present

## 2017-06-26 ENCOUNTER — Other Ambulatory Visit: Payer: Self-pay | Admitting: *Deleted

## 2017-06-26 DIAGNOSIS — I712 Thoracic aortic aneurysm, without rupture, unspecified: Secondary | ICD-10-CM

## 2017-07-15 DIAGNOSIS — N400 Enlarged prostate without lower urinary tract symptoms: Secondary | ICD-10-CM | POA: Insufficient documentation

## 2017-07-15 DIAGNOSIS — G2581 Restless legs syndrome: Secondary | ICD-10-CM | POA: Insufficient documentation

## 2017-07-15 DIAGNOSIS — R350 Frequency of micturition: Secondary | ICD-10-CM | POA: Diagnosis not present

## 2017-07-15 DIAGNOSIS — M159 Polyosteoarthritis, unspecified: Secondary | ICD-10-CM | POA: Diagnosis not present

## 2017-07-15 DIAGNOSIS — N401 Enlarged prostate with lower urinary tract symptoms: Secondary | ICD-10-CM | POA: Diagnosis not present

## 2017-07-15 HISTORY — DX: Restless legs syndrome: G25.81

## 2017-07-15 HISTORY — DX: Benign prostatic hyperplasia without lower urinary tract symptoms: N40.0

## 2017-07-17 DIAGNOSIS — J342 Deviated nasal septum: Secondary | ICD-10-CM | POA: Diagnosis not present

## 2017-07-17 DIAGNOSIS — H9190 Unspecified hearing loss, unspecified ear: Secondary | ICD-10-CM | POA: Diagnosis not present

## 2017-07-17 DIAGNOSIS — H903 Sensorineural hearing loss, bilateral: Secondary | ICD-10-CM | POA: Diagnosis not present

## 2017-07-22 ENCOUNTER — Ambulatory Visit (INDEPENDENT_AMBULATORY_CARE_PROVIDER_SITE_OTHER): Payer: PPO | Admitting: Cardiothoracic Surgery

## 2017-07-22 ENCOUNTER — Ambulatory Visit
Admission: RE | Admit: 2017-07-22 | Discharge: 2017-07-22 | Disposition: A | Discharge status: Home / Self Care | Payer: PPO | Source: Ambulatory Visit | Attending: Cardiothoracic Surgery | Admitting: Cardiothoracic Surgery

## 2017-07-22 DIAGNOSIS — I7121 Aneurysm of the ascending aorta, without rupture: Secondary | ICD-10-CM

## 2017-07-22 DIAGNOSIS — I712 Thoracic aortic aneurysm, without rupture, unspecified: Secondary | ICD-10-CM

## 2017-07-22 DIAGNOSIS — R918 Other nonspecific abnormal finding of lung field: Secondary | ICD-10-CM | POA: Diagnosis not present

## 2017-07-22 MED ORDER — IOPAMIDOL (ISOVUE-370) INJECTION 76%
75.0000 mL | Freq: Once | INTRAVENOUS | Status: AC | PRN
  Administered 2017-07-22: 75 mL via INTRAVENOUS

## 2017-07-23 NOTE — Progress Notes (Signed)
NAME:  Jeffrey Mahoney, CLAYTOR NO.:  MEDICAL RECORD NO.:  [id number]  LOCATION:                                 FACILITY:  PHYSICIAN:  Ivin Poot, M.D.       DATE OF BIRTH:                                PROGRESS NOTE   HISTORY OF PRESENT ILLNESS: The patient is a 76 year old obese Caucasian male, who presents for 6- month followup with CT scan of a recently diagnosed 4.7 cm fusiform ascending aneurysm and a 6.2 mm left lower lobe peripheral round nodule. These two problems are asymptomatic.  The patient has hypertension treated with metoprolol.  He has no active pulmonary symptoms and a remote history of smoking.  The current scan is compared to his scan from 6 months ago.  The current scan shows no change in the fusiform 4.7 cm ascending aneurysm.  There was no evidence of intramural hematoma or penetrating ulceration.  The left lower lobe round nodule measures basically the same at 6.4 mm.  There were no other new pulmonary nodules or suspicious mediastinal adenopathy noted.  PHYSICAL EXAMINATION: VITAL SIGNS:  Blood pressure 145/85, heart rate 60, weight 200 pounds. GENERAL APPEARANCE:  An elderly obese Caucasian male in no acute distress. HEENT:  Normocephalic.  No cervical adenopathy or JVD. CHEST:  Breath sounds are clear and equal. CARDIAC:  Regular rhythm without murmur or S3 gallop. NEUROLOGIC:  Intact.  LABORATORY DATA: CT scan today shows no change in the left lower lobe pulmonary nodule or 4.7 cm fusiform ascending aneurysm in the past 6 months.  PLAN: We will continue with surveillance scans and since there have been no changes in 6 months, we will repeat the scan in 1 year.     Ivin Poot, M.D.     PV/MEDQ  D:  07/22/2017  T:  07/22/2017  Job:  [id number]

## 2017-08-10 DIAGNOSIS — H2513 Age-related nuclear cataract, bilateral: Secondary | ICD-10-CM | POA: Diagnosis not present

## 2017-08-11 DIAGNOSIS — G629 Polyneuropathy, unspecified: Secondary | ICD-10-CM | POA: Diagnosis not present

## 2017-08-14 DIAGNOSIS — G629 Polyneuropathy, unspecified: Secondary | ICD-10-CM

## 2017-08-14 DIAGNOSIS — E038 Other specified hypothyroidism: Secondary | ICD-10-CM | POA: Diagnosis not present

## 2017-08-14 DIAGNOSIS — M1711 Unilateral primary osteoarthritis, right knee: Secondary | ICD-10-CM | POA: Diagnosis not present

## 2017-08-14 DIAGNOSIS — K219 Gastro-esophageal reflux disease without esophagitis: Secondary | ICD-10-CM | POA: Diagnosis not present

## 2017-08-14 DIAGNOSIS — N3281 Overactive bladder: Secondary | ICD-10-CM | POA: Diagnosis not present

## 2017-08-14 HISTORY — DX: Polyneuropathy, unspecified: G62.9

## 2017-09-02 DIAGNOSIS — M1711 Unilateral primary osteoarthritis, right knee: Secondary | ICD-10-CM | POA: Diagnosis not present

## 2017-09-02 DIAGNOSIS — N3281 Overactive bladder: Secondary | ICD-10-CM | POA: Diagnosis not present

## 2017-09-02 DIAGNOSIS — K219 Gastro-esophageal reflux disease without esophagitis: Secondary | ICD-10-CM | POA: Diagnosis not present

## 2017-09-02 DIAGNOSIS — M62831 Muscle spasm of calf: Secondary | ICD-10-CM | POA: Diagnosis not present

## 2017-09-28 DIAGNOSIS — E038 Other specified hypothyroidism: Secondary | ICD-10-CM | POA: Diagnosis not present

## 2017-09-28 DIAGNOSIS — M1711 Unilateral primary osteoarthritis, right knee: Secondary | ICD-10-CM | POA: Diagnosis not present

## 2017-09-28 DIAGNOSIS — N3281 Overactive bladder: Secondary | ICD-10-CM | POA: Diagnosis not present

## 2017-09-28 DIAGNOSIS — K219 Gastro-esophageal reflux disease without esophagitis: Secondary | ICD-10-CM | POA: Diagnosis not present

## 2017-09-30 DIAGNOSIS — G4733 Obstructive sleep apnea (adult) (pediatric): Secondary | ICD-10-CM | POA: Diagnosis not present

## 2017-09-30 DIAGNOSIS — G2581 Restless legs syndrome: Secondary | ICD-10-CM | POA: Diagnosis not present

## 2017-09-30 DIAGNOSIS — R5383 Other fatigue: Secondary | ICD-10-CM | POA: Diagnosis not present

## 2017-09-30 DIAGNOSIS — J449 Chronic obstructive pulmonary disease, unspecified: Secondary | ICD-10-CM | POA: Diagnosis not present

## 2017-09-30 DIAGNOSIS — R911 Solitary pulmonary nodule: Secondary | ICD-10-CM | POA: Diagnosis not present

## 2017-10-14 DIAGNOSIS — M1711 Unilateral primary osteoarthritis, right knee: Secondary | ICD-10-CM | POA: Diagnosis not present

## 2017-11-16 DIAGNOSIS — R350 Frequency of micturition: Secondary | ICD-10-CM | POA: Diagnosis not present

## 2017-11-16 DIAGNOSIS — N401 Enlarged prostate with lower urinary tract symptoms: Secondary | ICD-10-CM | POA: Diagnosis not present

## 2017-11-16 DIAGNOSIS — R3914 Feeling of incomplete bladder emptying: Secondary | ICD-10-CM | POA: Diagnosis not present

## 2017-11-16 DIAGNOSIS — R972 Elevated prostate specific antigen [PSA]: Secondary | ICD-10-CM | POA: Diagnosis not present

## 2017-11-17 DIAGNOSIS — G8929 Other chronic pain: Secondary | ICD-10-CM

## 2017-11-17 DIAGNOSIS — M170 Bilateral primary osteoarthritis of knee: Secondary | ICD-10-CM | POA: Diagnosis not present

## 2017-11-17 DIAGNOSIS — M25561 Pain in right knee: Secondary | ICD-10-CM

## 2017-11-17 DIAGNOSIS — M25562 Pain in left knee: Secondary | ICD-10-CM

## 2017-11-17 HISTORY — DX: Other chronic pain: G89.29

## 2017-11-18 DIAGNOSIS — M170 Bilateral primary osteoarthritis of knee: Secondary | ICD-10-CM | POA: Diagnosis not present

## 2017-11-18 DIAGNOSIS — M11261 Other chondrocalcinosis, right knee: Secondary | ICD-10-CM | POA: Diagnosis not present

## 2017-11-18 DIAGNOSIS — G8929 Other chronic pain: Secondary | ICD-10-CM | POA: Diagnosis not present

## 2017-11-18 DIAGNOSIS — M25462 Effusion, left knee: Secondary | ICD-10-CM | POA: Diagnosis not present

## 2017-11-18 DIAGNOSIS — M25562 Pain in left knee: Secondary | ICD-10-CM | POA: Diagnosis not present

## 2017-11-18 DIAGNOSIS — M25561 Pain in right knee: Secondary | ICD-10-CM | POA: Diagnosis not present

## 2017-11-19 DIAGNOSIS — I10 Essential (primary) hypertension: Secondary | ICD-10-CM | POA: Diagnosis not present

## 2017-11-19 DIAGNOSIS — R7309 Other abnormal glucose: Secondary | ICD-10-CM | POA: Diagnosis not present

## 2017-11-19 DIAGNOSIS — R001 Bradycardia, unspecified: Secondary | ICD-10-CM | POA: Diagnosis not present

## 2017-11-19 DIAGNOSIS — I712 Thoracic aortic aneurysm, without rupture: Secondary | ICD-10-CM | POA: Diagnosis not present

## 2017-11-19 DIAGNOSIS — Z01818 Encounter for other preprocedural examination: Secondary | ICD-10-CM | POA: Diagnosis not present

## 2017-12-03 DIAGNOSIS — L219 Seborrheic dermatitis, unspecified: Secondary | ICD-10-CM | POA: Diagnosis not present

## 2017-12-11 ENCOUNTER — Ambulatory Visit: Payer: Self-pay | Admitting: Diagnostic Neuroimaging

## 2017-12-21 DIAGNOSIS — M21072 Valgus deformity, not elsewhere classified, left ankle: Secondary | ICD-10-CM | POA: Diagnosis not present

## 2017-12-21 DIAGNOSIS — M2042 Other hammer toe(s) (acquired), left foot: Secondary | ICD-10-CM | POA: Insufficient documentation

## 2017-12-21 DIAGNOSIS — L84 Corns and callosities: Secondary | ICD-10-CM | POA: Diagnosis not present

## 2017-12-21 HISTORY — DX: Other hammer toe(s) (acquired), left foot: M20.42

## 2017-12-22 DIAGNOSIS — J988 Other specified respiratory disorders: Secondary | ICD-10-CM | POA: Diagnosis not present

## 2018-01-18 ENCOUNTER — Other Ambulatory Visit: Payer: Self-pay | Admitting: Orthopedic Surgery

## 2018-02-08 ENCOUNTER — Ambulatory Visit: Admit: 2018-02-08 | Payer: PPO | Admitting: Orthopedic Surgery

## 2018-02-08 SURGERY — ARTHROPLASTY, KNEE, TOTAL
Anesthesia: Spinal | Laterality: Right

## 2018-03-17 NOTE — Pre-Procedure Instructions (Signed)
JONERIK SLIKER  03/17/2018      [id number] - [place], [place] - [address] [place] [address] Phone: [phone] Fax: [phone]    Your procedure is scheduled on Monday April 15.  Report to [facility] Admitting at 5:30 A.M.  Call this number if you have problems the morning of surgery:  [phone]   Remember:  Do not eat food or drink liquids after midnight.  Take these medicines the morning of surgery with A SIP OF WATER:   Metoprolol (Toprol-XL) Omeprazole (prilosec) Cetirizine (zyrtec) Dutasteride (Avodart) Levothyroxine (synthroid) Tolterodine (Detrola Tramadol (ultram) if needed  7 days prior to surgery STOP taking any Aleve, Naproxen, Ibuprofen, Motrin, Advil, Goody's, BC's, all herbal medications, fish oil, and all vitamins  **Follow your surgeon's instructions on stopping Aspirin. If no instructions were given, please call your surgeon's office**   Do not wear jewelry, make-up or nail polish.  Do not wear lotions, powders, or perfumes, or deodorant.  Do not shave 48 hours prior to surgery.  Men may shave face and neck.  Do not bring valuables to the hospital.  [facility] is not responsible for any belongings or valuables.  Contacts, dentures or bridgework may not be worn into surgery.  Leave your suitcase in the car.  After surgery it may be brought to your room.  For patients admitted to the hospital, discharge time will be determined by your treatment team.  Patients discharged the day of surgery will not be allowed to drive home.   Special instructions:    [facility]- Preparing For Surgery  Before surgery, you can play an important role. Because skin is not sterile, your skin needs to be as free of germs as possible. You can reduce the number of germs on your skin by washing with CHG (chlorahexidine gluconate) Soap before surgery.  CHG is an antiseptic cleaner which kills germs and  bonds with the skin to continue killing germs even after washing.  Please do not use if you have an allergy to CHG or antibacterial soaps. If your skin becomes reddened/irritated stop using the CHG.  Do not shave (including legs and underarms) for at least 48 hours prior to first CHG shower. It is OK to shave your face.  Please follow these instructions carefully.   1. Shower the NIGHT BEFORE SURGERY and the MORNING OF SURGERY with CHG.   2. If you chose to wash your hair, wash your hair first as usual with your normal shampoo.  3. After you shampoo, rinse your hair and body thoroughly to remove the shampoo.  4. Use CHG as you would any other liquid soap. You can apply CHG directly to the skin and wash gently with a scrungie or a clean washcloth.   5. Apply the CHG Soap to your body ONLY FROM THE NECK DOWN.  Do not use on open wounds or open sores. Avoid contact with your eyes, ears, mouth and genitals (private parts). Wash Face and genitals (private parts)  with your normal soap.  6. Wash thoroughly, paying special attention to the area where your surgery will be performed.  7. Thoroughly rinse your body with warm water from the neck down.  8. DO NOT shower/wash with your normal soap after using and rinsing off the CHG Soap.  9. Pat yourself dry with a CLEAN TOWEL.  10. Wear CLEAN PAJAMAS to bed the night before surgery, wear comfortable  clothes the morning of surgery  11. Place CLEAN SHEETS on your bed the night of your first shower and DO NOT SLEEP WITH PETS.    Day of Surgery: Do not apply any deodorants/lotions. Please wear clean clothes to the hospital/surgery center.      Please read over the following fact sheets that you were given. Coughing and Deep Breathing, Total Joint Packet, MRSA Information and Surgical Site Infection Prevention

## 2018-03-18 ENCOUNTER — Encounter (HOSPITAL_COMMUNITY): Payer: Self-pay

## 2018-03-18 ENCOUNTER — Encounter (HOSPITAL_COMMUNITY)
Admission: RE | Admit: 2018-03-18 | Discharge: 2018-03-18 | Disposition: A | Discharge status: Home / Self Care | Payer: PPO | Source: Ambulatory Visit | Attending: Orthopedic Surgery | Admitting: Orthopedic Surgery

## 2018-03-18 ENCOUNTER — Other Ambulatory Visit: Payer: Self-pay

## 2018-03-18 DIAGNOSIS — Z01812 Encounter for preprocedural laboratory examination: Secondary | ICD-10-CM | POA: Diagnosis not present

## 2018-03-18 DIAGNOSIS — Z01810 Encounter for preprocedural cardiovascular examination: Secondary | ICD-10-CM | POA: Insufficient documentation

## 2018-03-18 HISTORY — DX: Essential (primary) hypertension: I10

## 2018-03-18 HISTORY — DX: Sleep apnea, unspecified: G47.30

## 2018-03-18 HISTORY — DX: Other specified postprocedural states: Z98.890

## 2018-03-18 HISTORY — DX: Personal history of urinary calculi: Z87.442

## 2018-03-18 HISTORY — DX: Cardiac murmur, unspecified: R01.1

## 2018-03-18 HISTORY — DX: Gastro-esophageal reflux disease without esophagitis: K21.9

## 2018-03-18 HISTORY — DX: Nausea with vomiting, unspecified: R11.2

## 2018-03-18 HISTORY — DX: Unspecified osteoarthritis, unspecified site: M19.90

## 2018-03-18 LAB — CBC WITH DIFFERENTIAL/PLATELET
BASOS PCT: 0 %
Basophils Absolute: 0 10*3/uL (ref 0.0–0.1)
EOS ABS: 0.2 10*3/uL (ref 0.0–0.7)
EOS PCT: 3 %
HCT: 43.6 % (ref 39.0–52.0)
Hemoglobin: 14.8 g/dL (ref 13.0–17.0)
LYMPHS ABS: 2 10*3/uL (ref 0.7–4.0)
Lymphocytes Relative: 26 %
MCH: 31.1 pg (ref 26.0–34.0)
MCHC: 33.9 g/dL (ref 30.0–36.0)
MCV: 91.6 fL (ref 78.0–100.0)
Monocytes Absolute: 0.5 10*3/uL (ref 0.1–1.0)
Monocytes Relative: 7 %
Neutro Abs: 4.9 10*3/uL (ref 1.7–7.7)
Neutrophils Relative %: 64 %
PLATELETS: 170 10*3/uL (ref 150–400)
RBC: 4.76 MIL/uL (ref 4.22–5.81)
RDW: 14 % (ref 11.5–15.5)
WBC: 7.6 10*3/uL (ref 4.0–10.5)

## 2018-03-18 LAB — COMPREHENSIVE METABOLIC PANEL
ALBUMIN: 3.8 g/dL (ref 3.5–5.0)
ALT: 21 U/L (ref 17–63)
ANION GAP: 11 (ref 5–15)
AST: 26 U/L (ref 15–41)
Alkaline Phosphatase: 80 U/L (ref 38–126)
BUN: 17 mg/dL (ref 6–20)
CHLORIDE: 102 mmol/L (ref 101–111)
CO2: 25 mmol/L (ref 22–32)
Calcium: 9.3 mg/dL (ref 8.9–10.3)
Creatinine, Ser: 1.13 mg/dL (ref 0.61–1.24)
GFR calc Af Amer: >60 mL/min (ref >60)
GFR calc non Af Amer: >60 mL/min (ref >60)
Glucose, Bld: 94 mg/dL (ref 65–99)
Potassium: 4.4 mmol/L (ref 3.5–5.1)
SODIUM: 138 mmol/L (ref 135–145)
Total Bilirubin: 1.1 mg/dL (ref 0.3–1.2)
Total Protein: 6.8 g/dL (ref 6.5–8.1)

## 2018-03-18 LAB — SURGICAL PCR SCREEN
MRSA, PCR: NEGATIVE
Staphylococcus aureus: NEGATIVE

## 2018-03-18 NOTE — Pre-Procedure Instructions (Signed)
ERASTO SLEIGHT  03/18/2018      [id number] [place], [place] - [address] [place] [address] Phone: [phone] Fax: [phone]    Your procedure is scheduled on Monday April 15.   Report to [facility] Admitting at 5:30 A.M.             (posted surgery time 7:30a - 8:45a)\   Call this number if you have problems the morning of surgery:  [phone]   Remember:  Do not eat food or drink liquids after midnight, Sunday.   Take these medicines the morning of surgery with A SIP OF WATER:   Metoprolol (Toprol-XL) Omeprazole (prilosec) Cetirizine (zyrtec) Dutasteride (Avodart) Levothyroxine (synthroid) Tolterodine (Detrola Tramadol (ultram) if needed  7 days prior to surgery STOP taking any Aleve, Naproxen, Ibuprofen, Motrin, Advil, Goody's, BC's, all herbal medications, fish oil, and all vitamins  **Follow your surgeon's instructions on stopping Aspirin. If no instructions were given, please call your surgeon's office**   Do not wear jewelry, make-up or nail polish.  Do not wear lotions, powders, or perfumes, or deodorant.  Do not shave 48 hours prior to surgery.  Men may shave face and neck.  Do not bring valuables to the hospital.  [facility] is not responsible for any belongings or valuables.  Contacts, dentures or bridgework may not be worn into surgery.  Leave your suitcase in the car.  After surgery it may be brought to your room.  For patients admitted to the hospital, discharge time will be determined by your treatment team.  Patients discharged the day of surgery will not be allowed to drive home.   Special instructions:    [facility]- Preparing For Surgery  Before surgery, you can play an important role. Because skin is not sterile, your skin needs to be as free of germs as possible. You can reduce the number of germs on your skin by washing with CHG (chlorahexidine gluconate) Soap before  surgery.  CHG is an antiseptic cleaner which kills germs and bonds with the skin to continue killing germs even after washing.  Please do not use if you have an allergy to CHG or antibacterial soaps. If your skin becomes reddened/irritated stop using the CHG.  Do not shave (including legs and underarms) for at least 48 hours prior to first CHG shower. It is OK to shave your face.  Please follow these instructions carefully.   1. Shower the NIGHT BEFORE SURGERY and the MORNING OF SURGERY with CHG.   2. If you chose to wash your hair, wash your hair first as usual with your normal shampoo.  3. After you shampoo, rinse your hair and body thoroughly to remove the shampoo.  4. Use CHG as you would any other liquid soap. You can apply CHG directly to the skin and wash gently with a scrungie or a clean washcloth.   5. Apply the CHG Soap to your body ONLY FROM THE NECK DOWN.  Do not use on open wounds or open sores. Avoid contact with your eyes, ears, mouth and genitals (private parts). Wash Face and genitals (private parts)  with your normal soap.  6. Wash thoroughly, paying special attention to the area where your surgery will be performed.  7. Thoroughly rinse your body with warm water from the neck down.  8. DO NOT shower/wash with your normal soap after using and rinsing off the CHG Soap.  9. Pat yourself dry with a CLEAN TOWEL.  10. Wear CLEAN PAJAMAS to bed the night before surgery, wear comfortable clothes the morning of surgery  11. Place CLEAN SHEETS on your bed the night of your first shower and DO NOT SLEEP WITH PETS.    Day of Surgery: Do not apply any deodorants/lotions. Please wear clean clothes to the hospital/surgery center.      Please read over the following fact sheets that you were given. Coughing and Deep Breathing, Total Joint Packet, MRSA Information and Surgical Site Infection Prevention
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
## 2018-03-18 NOTE — Progress Notes (Signed)
   03/18/18 0957  OBSTRUCTIVE SLEEP APNEA  Have you ever been diagnosed with sleep apnea through a sleep study?  (HE HAD 1 TEST DONE, BUT DID NOT DO OTHER TEST)  Do you snore loudly (loud enough to be heard through closed doors)?  1  Do you often feel tired, fatigued, or sleepy during the daytime (such as falling asleep during driving or talking to someone)? 1  Has anyone observed you stop breathing during your sleep? 0  Do you have, or are you being treated for high blood pressure? 1  BMI more than 35 kg/m2? 0  Neck circumference greater than:Male 16 inches or larger, Male 17inches or larger? 1  Male Gender (Yes=1) 1  Obstructive Sleep Apnea Score 5  Score 5 or greater  Results sent to PCP

## 2018-03-22 ENCOUNTER — Encounter (HOSPITAL_COMMUNITY): Payer: Self-pay

## 2018-03-22 NOTE — Progress Notes (Signed)
Anesthesia Chart Review:  Pt is a 77 year old male scheduled for our total knee arthroplasty on 03/29/2018 with Vickey Huger, MD  - PCP is Agnes Lawrence, PA (notes in care everywhere); by Dr. Ruel Favors notes, pt cleared for surgery.   - CT surgeon is Ivin Poot, MD who follows ascending aortic aneurysm and pulmonary nodule. Last office visit 07/22/17; 1 year f/u recommended. Dr. Prescott Gum cleared pt for surgery  PMH includes: Heart murmur, a sending aortic aneurysm (4.7 cm by 07/22/17 CT angios chest), HTN, COPD, OSA, hypothyroidism, post-op N/V, GERD.  Former smoker (quit 1989).  BMI 29.5  Medications include: ASA 81 mg, levothyroxine, metoprolol, Prilosec  BP 139/85   Pulse 77   Temp 36.4 C   Resp 20   Ht 5\' 10"  (1.778 m)   Wt 204 lb 9.6 oz (92.8 kg)   SpO2 98%   BMI 29.36 kg/m   Preoperative labs reviewed.    EKG 03/18/18: Sinus rhythm with PAC  CT angio chest 07/22/17:  - Stable 4.6 cm ascending thoracic aortic aneurysm.   - Stable 6 mm nodule seen in left lower lobe. - Emphysema  Echo 11/20/16:  1.  Mild concentric LVH with normal systolic function.  EF 60-65%.  No segmental abnormality. 2.  Aortic sclerosis with mild aortic regurgitation. 3.  Mild dilatation of aortic root and ascending aorta (4.3 cm). 4.  Normal right heart size/function, only trace TR  If no changes, I anticipate pt can proceed with surgery as scheduled.   Willeen Cass, FNP-BC [facility]/Anesthesiology Phone: [phone] 03/22/2018 3:31 PM

## 2018-03-26 MED ORDER — BUPIVACAINE LIPOSOME 1.3 % IJ SUSP
20.0000 mL | INTRAMUSCULAR | Status: AC
  Administered 2018-03-29: 20 mL
  Filled 2018-03-26: qty 20

## 2018-03-26 MED ORDER — TRANEXAMIC ACID 1000 MG/10ML IV SOLN
1000.0000 mg | INTRAVENOUS | Status: AC
  Administered 2018-03-29: 1000 mg via INTRAVENOUS
  Filled 2018-03-26: qty 1100

## 2018-03-28 NOTE — Anesthesia Preprocedure Evaluation (Addendum)
Anesthesia Evaluation  Patient identified by MRN, date of birth, ID band Patient awake    Reviewed: Allergy & Precautions, NPO status , Patient's Chart, lab work & pertinent test results  History of Anesthesia Complications (+) PONV and history of anesthetic complications  Airway Mallampati: II  TM Distance: >3 FB Neck ROM: Full    Dental no notable dental hx. (+) Teeth Intact, Dental Advisory Given   Pulmonary sleep apnea , COPD, former smoker,    Pulmonary exam normal breath sounds clear to auscultation       Cardiovascular hypertension, Pt. on medications and Pt. on home beta blockers Normal cardiovascular exam Rhythm:Regular Rate:Normal   EKG 03/18/18: Sinus rhythm with PAC  CT angio chest 07/22/17:  - Stable 4.6 cm ascending thoracic aortic aneurysm.   - Stable 6 mm nodule seen in left lower lobe. - Emphysema  Echo 11/20/16:  1.  Mild concentric LVH with normal systolic function.  EF 60-65%.  No segmental abnormality. 2.  Aortic sclerosis with mild aortic regurgitation. 3.  Mild dilatation of aortic root and ascending aorta (4.3 cm). 4.  Normal right heart size/function, only trace TR      Neuro/Psych negative neurological ROS  negative psych ROS   GI/Hepatic Neg liver ROS, GERD  Medicated and Controlled,  Endo/Other  Hypothyroidism   Renal/GU negative Renal ROS  negative genitourinary   Musculoskeletal  (+) Arthritis , Osteoarthritis,    Abdominal   Peds negative pediatric ROS (+)  Hematology negative hematology ROS (+)   Anesthesia Other Findings   Reproductive/Obstetrics negative OB ROS                            Anesthesia Physical Anesthesia Plan  ASA: III  Anesthesia Plan: Spinal and Regional   Post-op Pain Management:  Regional for Post-op pain   Induction:   PONV Risk Score and Plan: 1 and Treatment may vary due to age or medical condition  Airway  Management Planned: Nasal Cannula and Natural Airway  Additional Equipment:   Intra-op Plan:   Post-operative Plan:   Informed Consent: I have reviewed the patients History and Physical, chart, labs and discussed the procedure including the risks, benefits and alternatives for the proposed anesthesia with the patient or authorized representative who has indicated his/her understanding and acceptance.   Dental advisory given  Plan Discussed with: CRNA, Anesthesiologist and Surgeon  Anesthesia Plan Comments: (  )       Anesthesia Quick Evaluation

## 2018-03-29 ENCOUNTER — Encounter (HOSPITAL_COMMUNITY): Payer: Self-pay | Admitting: *Deleted

## 2018-03-29 ENCOUNTER — Ambulatory Visit (HOSPITAL_COMMUNITY): Payer: PPO | Admitting: Anesthesiology

## 2018-03-29 ENCOUNTER — Ambulatory Visit (HOSPITAL_COMMUNITY): Payer: PPO | Admitting: Emergency Medicine

## 2018-03-29 ENCOUNTER — Inpatient Hospital Stay (HOSPITAL_COMMUNITY)
Admission: AD | Admit: 2018-03-29 | Discharge: 2018-04-02 | DRG: 470 | Disposition: A | Discharge status: Skilled Nursing Facility | Payer: PPO | Source: Ambulatory Visit | Attending: Orthopedic Surgery | Admitting: Orthopedic Surgery

## 2018-03-29 ENCOUNTER — Encounter (HOSPITAL_COMMUNITY)
Admission: AD | Disposition: A | Discharge status: Skilled Nursing Facility | Payer: Self-pay | Source: Ambulatory Visit | Attending: Orthopedic Surgery

## 2018-03-29 ENCOUNTER — Other Ambulatory Visit: Payer: Self-pay

## 2018-03-29 DIAGNOSIS — I358 Other nonrheumatic aortic valve disorders: Secondary | ICD-10-CM | POA: Diagnosis not present

## 2018-03-29 DIAGNOSIS — J449 Chronic obstructive pulmonary disease, unspecified: Secondary | ICD-10-CM | POA: Diagnosis not present

## 2018-03-29 DIAGNOSIS — M1711 Unilateral primary osteoarthritis, right knee: Principal | ICD-10-CM | POA: Diagnosis present

## 2018-03-29 DIAGNOSIS — J309 Allergic rhinitis, unspecified: Secondary | ICD-10-CM | POA: Diagnosis not present

## 2018-03-29 DIAGNOSIS — I359 Nonrheumatic aortic valve disorder, unspecified: Secondary | ICD-10-CM | POA: Diagnosis not present

## 2018-03-29 DIAGNOSIS — G8918 Other acute postprocedural pain: Secondary | ICD-10-CM | POA: Diagnosis not present

## 2018-03-29 DIAGNOSIS — Z471 Aftercare following joint replacement surgery: Secondary | ICD-10-CM | POA: Diagnosis not present

## 2018-03-29 DIAGNOSIS — K219 Gastro-esophageal reflux disease without esophagitis: Secondary | ICD-10-CM | POA: Diagnosis not present

## 2018-03-29 DIAGNOSIS — R0603 Acute respiratory distress: Secondary | ICD-10-CM

## 2018-03-29 DIAGNOSIS — I10 Essential (primary) hypertension: Secondary | ICD-10-CM | POA: Diagnosis not present

## 2018-03-29 DIAGNOSIS — E039 Hypothyroidism, unspecified: Secondary | ICD-10-CM | POA: Diagnosis present

## 2018-03-29 DIAGNOSIS — Z7982 Long term (current) use of aspirin: Secondary | ICD-10-CM

## 2018-03-29 DIAGNOSIS — Z96651 Presence of right artificial knee joint: Secondary | ICD-10-CM | POA: Diagnosis not present

## 2018-03-29 DIAGNOSIS — S8990XA Unspecified injury of unspecified lower leg, initial encounter: Secondary | ICD-10-CM | POA: Diagnosis not present

## 2018-03-29 DIAGNOSIS — N400 Enlarged prostate without lower urinary tract symptoms: Secondary | ICD-10-CM | POA: Diagnosis not present

## 2018-03-29 DIAGNOSIS — Z79899 Other long term (current) drug therapy: Secondary | ICD-10-CM | POA: Diagnosis not present

## 2018-03-29 DIAGNOSIS — Z96659 Presence of unspecified artificial knee joint: Secondary | ICD-10-CM

## 2018-03-29 DIAGNOSIS — G8911 Acute pain due to trauma: Secondary | ICD-10-CM | POA: Diagnosis not present

## 2018-03-29 HISTORY — DX: Headache: R51

## 2018-03-29 HISTORY — PX: TOTAL KNEE ARTHROPLASTY: SHX125

## 2018-03-29 HISTORY — DX: Nonrheumatic aortic valve disorder, unspecified: I35.9

## 2018-03-29 HISTORY — DX: Headache, unspecified: R51.9

## 2018-03-29 HISTORY — DX: Personal history of other medical treatment: Z92.89

## 2018-03-29 HISTORY — DX: Presence of unspecified artificial knee joint: Z96.659

## 2018-03-29 HISTORY — DX: Pneumonia, unspecified organism: J18.9

## 2018-03-29 SURGERY — ARTHROPLASTY, KNEE, TOTAL
Anesthesia: Regional | Site: Knee | Laterality: Right

## 2018-03-29 MED ORDER — FESOTERODINE FUMARATE ER 8 MG PO TB24
8.0000 mg | ORAL_TABLET | Freq: Every day | ORAL | Status: DC
  Administered 2018-03-30 – 2018-04-02 (×4): 8 mg via ORAL
  Filled 2018-03-29 (×4): qty 1

## 2018-03-29 MED ORDER — BUPIVACAINE-EPINEPHRINE (PF) 0.5% -1:200000 IJ SOLN
INTRAMUSCULAR | Status: AC
  Filled 2018-03-29: qty 30

## 2018-03-29 MED ORDER — FLEET ENEMA 7-19 GM/118ML RE ENEM: 1.0000 | ENEMA | Freq: Once | RECTAL | Status: DC | PRN

## 2018-03-29 MED ORDER — FENTANYL CITRATE (PF) 100 MCG/2ML IJ SOLN: 25.0000 ug | INTRAMUSCULAR | Status: DC | PRN

## 2018-03-29 MED ORDER — PHENYLEPHRINE 40 MCG/ML (10ML) SYRINGE FOR IV PUSH (FOR BLOOD PRESSURE SUPPORT)
PREFILLED_SYRINGE | INTRAVENOUS | Status: AC
  Filled 2018-03-29: qty 10

## 2018-03-29 MED ORDER — DEXAMETHASONE SODIUM PHOSPHATE 10 MG/ML IJ SOLN
10.0000 mg | Freq: Once | INTRAMUSCULAR | Status: AC
  Administered 2018-03-30: 10 mg via INTRAVENOUS
  Filled 2018-03-29: qty 1

## 2018-03-29 MED ORDER — ONDANSETRON HCL 4 MG/2ML IJ SOLN
INTRAMUSCULAR | Status: DC | PRN
  Administered 2018-03-29: 4 mg via INTRAVENOUS

## 2018-03-29 MED ORDER — CEFAZOLIN SODIUM-DEXTROSE 2-4 GM/100ML-% IV SOLN
2.0000 g | INTRAVENOUS | Status: AC
  Administered 2018-03-29: 2 g via INTRAVENOUS
  Filled 2018-03-29: qty 100

## 2018-03-29 MED ORDER — DEXAMETHASONE SODIUM PHOSPHATE 10 MG/ML IJ SOLN
8.0000 mg | Freq: Once | INTRAMUSCULAR | Status: DC
  Filled 2018-03-29: qty 1

## 2018-03-29 MED ORDER — FENTANYL CITRATE (PF) 250 MCG/5ML IJ SOLN
INTRAMUSCULAR | Status: AC
  Filled 2018-03-29: qty 5

## 2018-03-29 MED ORDER — DUTASTERIDE 0.5 MG PO CAPS
0.5000 mg | ORAL_CAPSULE | Freq: Every day | ORAL | Status: DC
  Administered 2018-03-30 – 2018-04-02 (×4): 0.5 mg via ORAL
  Filled 2018-03-29 (×4): qty 1

## 2018-03-29 MED ORDER — PHENOL 1.4 % MT LIQD: 1.0000 | OROMUCOSAL | Status: DC | PRN

## 2018-03-29 MED ORDER — PANTOPRAZOLE SODIUM 40 MG PO TBEC: 40.0000 mg | DELAYED_RELEASE_TABLET | Freq: Every day | ORAL | Status: DC

## 2018-03-29 MED ORDER — MEPERIDINE HCL 50 MG/ML IJ SOLN: 6.2500 mg | INTRAMUSCULAR | Status: DC | PRN

## 2018-03-29 MED ORDER — ALUM & MAG HYDROXIDE-SIMETH 200-200-20 MG/5ML PO SUSP: 30.0000 mL | ORAL | Status: DC | PRN

## 2018-03-29 MED ORDER — PROPOFOL 500 MG/50ML IV EMUL
INTRAVENOUS | Status: DC | PRN
  Administered 2018-03-29: 75 ug/kg/min via INTRAVENOUS

## 2018-03-29 MED ORDER — GABAPENTIN 300 MG PO CAPS
300.0000 mg | ORAL_CAPSULE | Freq: Once | ORAL | Status: AC
  Administered 2018-03-29: 300 mg via ORAL
  Filled 2018-03-29: qty 1

## 2018-03-29 MED ORDER — PHENYLEPHRINE 40 MCG/ML (10ML) SYRINGE FOR IV PUSH (FOR BLOOD PRESSURE SUPPORT)
PREFILLED_SYRINGE | INTRAVENOUS | Status: DC | PRN
  Administered 2018-03-29: 80 ug via INTRAVENOUS
  Administered 2018-03-29: 40 ug via INTRAVENOUS
  Administered 2018-03-29 (×3): 80 ug via INTRAVENOUS
  Administered 2018-03-29: 120 ug via INTRAVENOUS
  Administered 2018-03-29: 80 ug via INTRAVENOUS

## 2018-03-29 MED ORDER — METHOCARBAMOL 1000 MG/10ML IJ SOLN
500.0000 mg | Freq: Four times a day (QID) | INTRAMUSCULAR | Status: DC | PRN
  Filled 2018-03-29: qty 5

## 2018-03-29 MED ORDER — BISACODYL 5 MG PO TBEC
5.0000 mg | DELAYED_RELEASE_TABLET | Freq: Every day | ORAL | Status: DC | PRN
  Administered 2018-04-01: 5 mg via ORAL
  Filled 2018-03-29: qty 1

## 2018-03-29 MED ORDER — DIPHENHYDRAMINE HCL 12.5 MG/5ML PO ELIX: 12.5000 mg | ORAL_SOLUTION | ORAL | Status: DC | PRN

## 2018-03-29 MED ORDER — EPHEDRINE SULFATE 50 MG/ML IJ SOLN
INTRAMUSCULAR | Status: DC | PRN
  Administered 2018-03-29: 5 mg via INTRAVENOUS

## 2018-03-29 MED ORDER — BUPIVACAINE-EPINEPHRINE (PF) 0.25% -1:200000 IJ SOLN
INTRAMUSCULAR | Status: AC
  Filled 2018-03-29: qty 30

## 2018-03-29 MED ORDER — METOCLOPRAMIDE HCL 5 MG/ML IJ SOLN
5.0000 mg | Freq: Three times a day (TID) | INTRAMUSCULAR | Status: DC | PRN
  Administered 2018-03-30: 10 mg via INTRAVENOUS
  Filled 2018-03-29: qty 2

## 2018-03-29 MED ORDER — BUPIVACAINE LIPOSOME 1.3 % IJ SUSP
20.0000 mL | Freq: Once | INTRAMUSCULAR | Status: DC
  Filled 2018-03-29: qty 20

## 2018-03-29 MED ORDER — PROPOFOL 10 MG/ML IV BOLUS
INTRAVENOUS | Status: DC | PRN
  Administered 2018-03-29: 20 mg via INTRAVENOUS

## 2018-03-29 MED ORDER — ASPIRIN EC 325 MG PO TBEC
325.0000 mg | DELAYED_RELEASE_TABLET | Freq: Two times a day (BID) | ORAL | Status: DC
  Administered 2018-03-29 – 2018-04-02 (×8): 325 mg via ORAL
  Filled 2018-03-29 (×8): qty 1

## 2018-03-29 MED ORDER — TRANEXAMIC ACID 1000 MG/10ML IV SOLN
1000.0000 mg | Freq: Once | INTRAVENOUS | Status: DC
  Filled 2018-03-29: qty 10

## 2018-03-29 MED ORDER — DOCUSATE SODIUM 100 MG PO CAPS
100.0000 mg | ORAL_CAPSULE | Freq: Two times a day (BID) | ORAL | Status: DC
  Administered 2018-03-30 – 2018-04-02 (×7): 100 mg via ORAL
  Filled 2018-03-29 (×7): qty 1

## 2018-03-29 MED ORDER — METOCLOPRAMIDE HCL 5 MG PO TABS: 5.0000 mg | ORAL_TABLET | Freq: Three times a day (TID) | ORAL | Status: DC | PRN

## 2018-03-29 MED ORDER — METOPROLOL SUCCINATE 12.5 MG HALF TABLET
12.5000 mg | ORAL_TABLET | Freq: Every day | ORAL | Status: DC
  Administered 2018-03-30 – 2018-04-02 (×4): 12.5 mg via ORAL
  Filled 2018-03-29 (×5): qty 1

## 2018-03-29 MED ORDER — GABAPENTIN 100 MG PO CAPS
100.0000 mg | ORAL_CAPSULE | Freq: Every evening | ORAL | Status: DC | PRN
  Administered 2018-03-29 – 2018-03-31 (×3): 100 mg via ORAL
  Filled 2018-03-29 (×3): qty 1

## 2018-03-29 MED ORDER — HYDROMORPHONE HCL 2 MG/ML IJ SOLN: 0.2500 mg | INTRAMUSCULAR | Status: DC | PRN

## 2018-03-29 MED ORDER — PROMETHAZINE HCL 25 MG/ML IJ SOLN: 6.2500 mg | INTRAMUSCULAR | Status: DC | PRN

## 2018-03-29 MED ORDER — ONDANSETRON HCL 4 MG/2ML IJ SOLN
4.0000 mg | Freq: Four times a day (QID) | INTRAMUSCULAR | Status: DC | PRN
  Administered 2018-03-30: 4 mg via INTRAVENOUS
  Filled 2018-03-29 (×2): qty 2

## 2018-03-29 MED ORDER — OXYCODONE HCL 5 MG PO TABS
5.0000 mg | ORAL_TABLET | ORAL | Status: DC | PRN
  Administered 2018-03-29 (×2): 10 mg via ORAL
  Administered 2018-03-29: 5 mg via ORAL
  Administered 2018-03-30 – 2018-03-31 (×8): 10 mg via ORAL
  Filled 2018-03-29 (×11): qty 2

## 2018-03-29 MED ORDER — ROPIVACAINE HCL 7.5 MG/ML IJ SOLN
INTRAMUSCULAR | Status: DC | PRN
  Administered 2018-03-29: 20 mL via PERINEURAL

## 2018-03-29 MED ORDER — MIDAZOLAM HCL 2 MG/2ML IJ SOLN
INTRAMUSCULAR | Status: AC
  Filled 2018-03-29: qty 2

## 2018-03-29 MED ORDER — BUPIVACAINE IN DEXTROSE 0.75-8.25 % IT SOLN
INTRATHECAL | Status: DC | PRN
  Administered 2018-03-29: 2 mL via INTRATHECAL

## 2018-03-29 MED ORDER — LACTATED RINGERS IV SOLN
INTRAVENOUS | Status: DC | PRN
  Administered 2018-03-29 (×2): via INTRAVENOUS

## 2018-03-29 MED ORDER — LEVOTHYROXINE SODIUM 125 MCG PO TABS
125.0000 ug | ORAL_TABLET | Freq: Every day | ORAL | Status: DC
  Administered 2018-03-30 – 2018-04-02 (×4): 125 ug via ORAL
  Filled 2018-03-29 (×4): qty 1

## 2018-03-29 MED ORDER — HYDROMORPHONE HCL 2 MG/ML IJ SOLN
0.5000 mg | INTRAMUSCULAR | Status: DC | PRN
Start: 2018-03-29 — End: 2018-04-02

## 2018-03-29 MED ORDER — DEXAMETHASONE SODIUM PHOSPHATE 10 MG/ML IJ SOLN
INTRAMUSCULAR | Status: AC
  Filled 2018-03-29: qty 1

## 2018-03-29 MED ORDER — ACETAMINOPHEN 500 MG PO TABS
1000.0000 mg | ORAL_TABLET | Freq: Four times a day (QID) | ORAL | Status: AC
  Administered 2018-03-29 – 2018-03-30 (×4): 1000 mg via ORAL
  Filled 2018-03-29 (×4): qty 2

## 2018-03-29 MED ORDER — CHLORHEXIDINE GLUCONATE 4 % EX LIQD: 60.0000 mL | Freq: Once | CUTANEOUS | Status: DC

## 2018-03-29 MED ORDER — MENTHOL 3 MG MT LOZG: 1.0000 | LOZENGE | OROMUCOSAL | Status: DC | PRN

## 2018-03-29 MED ORDER — EPHEDRINE 5 MG/ML INJ
INTRAVENOUS | Status: AC
  Filled 2018-03-29: qty 10

## 2018-03-29 MED ORDER — CEFAZOLIN SODIUM-DEXTROSE 2-4 GM/100ML-% IV SOLN
2.0000 g | Freq: Four times a day (QID) | INTRAVENOUS | Status: AC
  Administered 2018-03-29 (×2): 2 g via INTRAVENOUS
  Filled 2018-03-29 (×3): qty 100

## 2018-03-29 MED ORDER — SODIUM CHLORIDE 0.9 % IR SOLN
Status: DC | PRN
  Administered 2018-03-29: 1000 mL

## 2018-03-29 MED ORDER — METHOCARBAMOL 500 MG PO TABS
500.0000 mg | ORAL_TABLET | Freq: Four times a day (QID) | ORAL | Status: DC | PRN
  Administered 2018-03-30 – 2018-03-31 (×5): 500 mg via ORAL
  Filled 2018-03-29 (×5): qty 1

## 2018-03-29 MED ORDER — TRAMADOL HCL 50 MG PO TABS
50.0000 mg | ORAL_TABLET | Freq: Four times a day (QID) | ORAL | Status: DC
  Administered 2018-03-29 – 2018-04-02 (×11): 50 mg via ORAL
  Filled 2018-03-29 (×11): qty 1

## 2018-03-29 MED ORDER — HYDROCODONE-ACETAMINOPHEN 7.5-325 MG PO TABS: 1.0000 | ORAL_TABLET | Freq: Once | ORAL | Status: DC | PRN

## 2018-03-29 MED ORDER — DEXAMETHASONE SODIUM PHOSPHATE 10 MG/ML IJ SOLN
INTRAMUSCULAR | Status: DC | PRN
  Administered 2018-03-29: 10 mg via INTRAVENOUS

## 2018-03-29 MED ORDER — BUPIVACAINE-EPINEPHRINE (PF) 0.25% -1:200000 IJ SOLN
INTRAMUSCULAR | Status: DC | PRN
  Administered 2018-03-29: 30 mL

## 2018-03-29 MED ORDER — SODIUM CHLORIDE 0.9 % IJ SOLN
INTRAMUSCULAR | Status: DC | PRN
  Administered 2018-03-29: 20 mL

## 2018-03-29 MED ORDER — ROPINIROLE HCL 1 MG PO TABS
4.0000 mg | ORAL_TABLET | Freq: Every day | ORAL | Status: DC
  Administered 2018-03-29 – 2018-04-01 (×4): 4 mg via ORAL
  Filled 2018-03-29 (×4): qty 4

## 2018-03-29 MED ORDER — ONDANSETRON HCL 4 MG/2ML IJ SOLN
INTRAMUSCULAR | Status: AC
  Filled 2018-03-29: qty 2

## 2018-03-29 MED ORDER — LIDOCAINE 2% (20 MG/ML) 5 ML SYRINGE
INTRAMUSCULAR | Status: AC
  Filled 2018-03-29: qty 5

## 2018-03-29 MED ORDER — ACETAMINOPHEN 10 MG/ML IV SOLN: 1000.0000 mg | Freq: Once | INTRAVENOUS | Status: DC | PRN

## 2018-03-29 MED ORDER — ONDANSETRON HCL 4 MG PO TABS
4.0000 mg | ORAL_TABLET | Freq: Four times a day (QID) | ORAL | Status: DC | PRN
  Administered 2018-03-30 – 2018-04-02 (×5): 4 mg via ORAL
  Filled 2018-03-29 (×6): qty 1

## 2018-03-29 MED ORDER — PANTOPRAZOLE SODIUM 40 MG PO TBEC
40.0000 mg | DELAYED_RELEASE_TABLET | Freq: Every day | ORAL | Status: DC
  Administered 2018-03-30 – 2018-04-02 (×4): 40 mg via ORAL
  Filled 2018-03-29 (×4): qty 1

## 2018-03-29 MED ORDER — HYDROMORPHONE HCL 1 MG/ML IJ SOLN: 0.5000 mg | INTRAMUSCULAR | Status: DC | PRN

## 2018-03-29 MED ORDER — PROPOFOL 10 MG/ML IV BOLUS
INTRAVENOUS | Status: AC
  Filled 2018-03-29: qty 20

## 2018-03-29 MED ORDER — ZOLPIDEM TARTRATE 5 MG PO TABS
5.0000 mg | ORAL_TABLET | Freq: Every evening | ORAL | Status: DC | PRN
  Administered 2018-03-29 – 2018-03-31 (×3): 5 mg via ORAL
  Filled 2018-03-29 (×3): qty 1

## 2018-03-29 MED ORDER — FENTANYL CITRATE (PF) 100 MCG/2ML IJ SOLN
INTRAMUSCULAR | Status: DC | PRN
  Administered 2018-03-29: 50 ug via INTRAVENOUS

## 2018-03-29 MED ORDER — SENNOSIDES-DOCUSATE SODIUM 8.6-50 MG PO TABS: 1.0000 | ORAL_TABLET | Freq: Every evening | ORAL | Status: DC | PRN

## 2018-03-29 MED ORDER — ACETAMINOPHEN 500 MG PO TABS
1000.0000 mg | ORAL_TABLET | Freq: Once | ORAL | Status: AC
  Administered 2018-03-29: 1000 mg via ORAL
  Filled 2018-03-29: qty 2

## 2018-03-29 SURGICAL SUPPLY — 67 items
ARTISURF 10M PLY R 6-9EF KNEE (Knees) ×3 IMPLANT
BANDAGE ACE 6X5 VEL STRL LF (GAUZE/BANDAGES/DRESSINGS) ×3 IMPLANT
BANDAGE ESMARK 6X9 LF (GAUZE/BANDAGES/DRESSINGS) ×1 IMPLANT
BLADE SAGITTAL 13X1.27X60 (BLADE) ×2 IMPLANT
BLADE SAGITTAL 13X1.27X60MM (BLADE) ×1
BLADE SAW SGTL 83.5X18.5 (BLADE) ×3 IMPLANT
BLADE SURG 10 STRL SS (BLADE) ×3 IMPLANT
BNDG ESMARK 6X9 LF (GAUZE/BANDAGES/DRESSINGS) ×3
BOWL SMART MIX CTS (DISPOSABLE) ×3 IMPLANT
CEMENT BONE SIMPLEX SPEEDSET (Cement) ×6 IMPLANT
CLOSURE WOUND 1/2 X4 (GAUZE/BANDAGES/DRESSINGS) ×1
COVER SURGICAL LIGHT HANDLE (MISCELLANEOUS) ×3 IMPLANT
CUFF TOURNIQUET SINGLE 34IN LL (TOURNIQUET CUFF) ×3 IMPLANT
DRAPE EXTREMITY T 121X128X90 (DRAPE) ×3 IMPLANT
DRAPE HALF SHEET 40X57 (DRAPES) ×3 IMPLANT
DRAPE INCISE IOBAN 66X45 STRL (DRAPES) ×6 IMPLANT
DRAPE U-SHAPE 47X51 STRL (DRAPES) ×3 IMPLANT
DRSG AQUACEL AG ADV 3.5X10 (GAUZE/BANDAGES/DRESSINGS) ×3 IMPLANT
DURAPREP 26ML APPLICATOR (WOUND CARE) ×6 IMPLANT
ELECT REM PT RETURN 9FT ADLT (ELECTROSURGICAL) ×3
ELECTRODE REM PT RTRN 9FT ADLT (ELECTROSURGICAL) ×1 IMPLANT
FEMUR  CMT CCR STD SZ9 R KNEE (Knees) ×2 IMPLANT
FEMUR CMT CCR STD SZ9 R KNEE (Knees) ×1 IMPLANT
FEMUR CMTD CCR STD SZ9 R KNEE (Knees) ×1 IMPLANT
GLOVE BIOGEL M 7.0 STRL (GLOVE) IMPLANT
GLOVE BIOGEL PI IND STRL 7.5 (GLOVE) IMPLANT
GLOVE BIOGEL PI IND STRL 8.5 (GLOVE) ×2 IMPLANT
GLOVE BIOGEL PI INDICATOR 7.5 (GLOVE)
GLOVE BIOGEL PI INDICATOR 8.5 (GLOVE) ×4
GLOVE SURG ORTHO 8.0 STRL STRW (GLOVE) ×6 IMPLANT
GOWN STRL REUS W/ TWL LRG LVL3 (GOWN DISPOSABLE) ×1 IMPLANT
GOWN STRL REUS W/ TWL XL LVL3 (GOWN DISPOSABLE) ×2 IMPLANT
GOWN STRL REUS W/TWL 2XL LVL3 (GOWN DISPOSABLE) ×3 IMPLANT
GOWN STRL REUS W/TWL LRG LVL3 (GOWN DISPOSABLE) ×2
GOWN STRL REUS W/TWL XL LVL3 (GOWN DISPOSABLE) ×4
HANDPIECE INTERPULSE COAX TIP (DISPOSABLE) ×2
HOOD PEEL AWAY FACE SHEILD DIS (HOOD) ×9 IMPLANT
KIT BASIN OR (CUSTOM PROCEDURE TRAY) ×3 IMPLANT
KIT TURNOVER KIT B (KITS) ×3 IMPLANT
MANIFOLD NEPTUNE II (INSTRUMENTS) ×3 IMPLANT
NEEDLE 18GX1X1/2 (RX/OR ONLY) (NEEDLE) IMPLANT
NEEDLE 22X1 1/2 (OR ONLY) (NEEDLE) ×6 IMPLANT
NS IRRIG 1000ML POUR BTL (IV SOLUTION) ×3 IMPLANT
PACK TOTAL JOINT (CUSTOM PROCEDURE TRAY) ×3 IMPLANT
PAD ARMBOARD 7.5X6 YLW CONV (MISCELLANEOUS) ×6 IMPLANT
SET HNDPC FAN SPRY TIP SCT (DISPOSABLE) ×1 IMPLANT
STEM POLY PAT PLY 38M KNEE (Knees) ×3 IMPLANT
STEM TIBIA 5 DEG SZ F R KNEE (Knees) ×1 IMPLANT
STRIP CLOSURE SKIN 1/2X4 (GAUZE/BANDAGES/DRESSINGS) ×2 IMPLANT
SUCTION FRAZIER HANDLE 10FR (MISCELLANEOUS)
SUCTION TUBE FRAZIER 10FR DISP (MISCELLANEOUS) IMPLANT
SUT BONE WAX W31G (SUTURE) ×3 IMPLANT
SUT MNCRL AB 3-0 PS2 18 (SUTURE) ×3 IMPLANT
SUT VIC AB 0 CT1 27 (SUTURE) ×2
SUT VIC AB 0 CT1 27XBRD ANBCTR (SUTURE) ×1 IMPLANT
SUT VIC AB 0 CTB1 27 (SUTURE) ×3 IMPLANT
SUT VIC AB 1 CT1 27 (SUTURE) ×4
SUT VIC AB 1 CT1 27XBRD ANBCTR (SUTURE) ×2 IMPLANT
SUT VIC AB 2-0 CT1 27 (SUTURE) ×4
SUT VIC AB 2-0 CT1 TAPERPNT 27 (SUTURE) ×2 IMPLANT
SUT VLOC 180 0 24IN GS25 (SUTURE) ×3 IMPLANT
SYR 20CC LL (SYRINGE) ×6 IMPLANT
TIBIA STEM 5 DEG SZ F R KNEE (Knees) ×3 IMPLANT
TOWEL OR 17X24 6PK STRL BLUE (TOWEL DISPOSABLE) ×3 IMPLANT
TOWEL OR 17X26 10 PK STRL BLUE (TOWEL DISPOSABLE) ×3 IMPLANT
TRAY CATH 16FR W/PLASTIC CATH (SET/KITS/TRAYS/PACK) IMPLANT
WRAP KNEE MAXI GEL POST OP (GAUZE/BANDAGES/DRESSINGS) ×3 IMPLANT

## 2018-03-29 NOTE — Anesthesia Postprocedure Evaluation (Signed)
Anesthesia Post Note  Patient: Jeffrey Mahoney  Procedure(s) Performed: RIGHT TOTAL KNEE ARTHROPLASTY (Right Knee)     Patient location during evaluation: PACU Anesthesia Type: Regional and Spinal Level of consciousness: oriented and awake and alert Pain management: pain level controlled Vital Signs Assessment: post-procedure vital signs reviewed and stable Respiratory status: spontaneous breathing, respiratory function stable and patient connected to nasal cannula oxygen Cardiovascular status: blood pressure returned to baseline and stable Postop Assessment: no headache, no backache and no apparent nausea or vomiting Anesthetic complications: no    Last Vitals:  Vitals:   03/29/18 1108 03/29/18 1131  BP:  109/73  Pulse:  66  Resp:  14  Temp: 36.5 C (!) 36.3 C  SpO2:  95%    Last Pain:  Vitals:   03/29/18 1131  TempSrc: Oral  PainSc:                  [place]

## 2018-03-29 NOTE — Transfer of Care (Signed)
Immediate Anesthesia Transfer of Care Note  Patient: Jeffrey Mahoney  Procedure(s) Performed: RIGHT TOTAL KNEE ARTHROPLASTY (Right Knee)  Patient Location: PACU  Anesthesia Type:MAC and Spinal  Level of Consciousness: awake, oriented and patient cooperative  Airway & Oxygen Therapy: Patient Spontanous Breathing and Patient connected to nasal cannula oxygen  Post-op Assessment: Report given to RN and Post -op Vital signs reviewed and stable  Post vital signs: Reviewed  Last Vitals:  Vitals Value Taken Time  BP 91/61 03/29/2018  9:22 AM  Temp    Pulse 68 03/29/2018  9:23 AM  Resp 16 03/29/2018  9:23 AM  SpO2 97 % 03/29/2018  9:23 AM  Vitals shown include unvalidated device data.  Last Pain:  Vitals:   03/29/18 0618  TempSrc: Oral  PainSc:       Patients Stated Pain Goal: 5 (73/40/37 0964)  Complications: No apparent anesthesia complications
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
## 2018-03-29 NOTE — Evaluation (Signed)
Physical Therapy Evaluation Patient Details Name: Jeffrey Mahoney MRN: [id number] DOB: [birth year] Today's Date: 03/29/2018   History of Present Illness  Pt is a 77 y/o male s/p elective R TKA. PMH includes COPD, HTN, and back surgery.   Clinical Impression  Pt is s/p surgery above with deficits below. Pt presenting with decreased sensation and unsteadiness, requiring min A for transfer to chair. Reviewed supine HEP and knee precautions with pt. Pt reports he plans to go to SNF at d/c prior to return home to increase independence with mobility. Will continue to follow acutely to maximize functional mobility independence and safety.     Follow Up Recommendations Follow surgeon's recommendation for DC plan and follow-up therapies;Supervision for mobility/OOB    Equipment Recommendations  None recommended by PT    Recommendations for Other Services       Precautions / Restrictions Precautions Precautions: Knee Precaution Booklet Issued: Yes (comment) Precaution Comments: Reviewed knee precautions and supine HEP.  Restrictions Weight Bearing Restrictions: Yes RLE Weight Bearing: Weight bearing as tolerated      Mobility  Bed Mobility Overal bed mobility: Needs Assistance Bed Mobility: Supine to Sit     Supine to sit: Min guard     General bed mobility comments: Min guard for safety. Use of elevated HOB.   Transfers Overall transfer level: Needs assistance Equipment used: Rolling walker (2 wheeled) Transfers: Sit to/from [facility] Sit to Stand: Min assist Stand pivot transfers: Min assist       General transfer comment: Min A for lift assist and steadying to stand. Min A for steadying throughout transfer to chair. Verbal cues for sequencing using RW.   Ambulation/Gait         Gait velocity: Pivotal steps to chair with cues for sequencing using RW.       Stairs            Wheelchair Mobility    Modified Rankin (Stroke Patients Only)       Balance Overall balance assessment: Needs assistance Sitting-balance support: No upper extremity supported;Feet supported Sitting balance-Leahy Scale: Good     Standing balance support: Bilateral upper extremity supported;During functional activity Standing balance-Leahy Scale: Poor Standing balance comment: Reliant on BUE support.                              Pertinent Vitals/Pain Pain Assessment: Faces Faces Pain Scale: Hurts a little bit Pain Location: R knee  Pain Descriptors / Indicators: Aching;Operative site guarding Pain Intervention(s): Limited activity within patient's tolerance;Monitored during session;Repositioned    Home Living Family/patient expects to be discharged to:: Skilled nursing facility                 Additional Comments: Reports he is going to [facility] at d/c     Prior Function Level of Independence: Independent               Hand Dominance        Extremity/Trunk Assessment   Upper Extremity Assessment Upper Extremity Assessment: Defer to OT evaluation    Lower Extremity Assessment Lower Extremity Assessment: RLE deficits/detail;LLE deficits/detail RLE Deficits / Details: Reports decreased sensation, especially in groin area. Deficits consistent with post op pain and weakness. Able to perform ther ex below.  LLE Deficits / Details: L ankle deficits at baseline.     Cervical / Trunk Assessment Cervical / Trunk Assessment: Normal  Communication   Communication: No difficulties  Cognition Arousal/Alertness: Awake/alert Behavior During Therapy: WFL for tasks assessed/performed Overall Cognitive Status: Within Functional Limits for tasks assessed                                        General Comments General comments (skin integrity, edema, etc.): Pt's wife present during session.     Exercises Total Joint Exercises Ankle Circles/Pumps: AROM;Both;20 reps Quad Sets: AROM;Right;10 reps Heel Slides:  AROM;Right;10 reps   Assessment/Plan    PT Assessment Patient needs continued PT services  PT Problem List Decreased strength;Impaired sensation;Pain;Decreased knowledge of use of DME;Decreased knowledge of precautions;Decreased balance;Decreased mobility;Decreased range of motion       PT Treatment Interventions DME instruction;Gait training;Functional mobility training;Therapeutic exercise;Therapeutic activities;Balance training;Neuromuscular re-education;Patient/family education    PT Goals (Current goals can be found in the Care Plan section)  Acute Rehab PT Goals Patient Stated Goal: to go to rehab before going home  PT Goal Formulation: With patient Time For Goal Achievement: 04/12/18 Potential to Achieve Goals: Good    Frequency 7X/week   Barriers to discharge        Co-evaluation               AM-PAC PT "6 Clicks" Daily Activity  Outcome Measure Difficulty turning over in bed (including adjusting bedclothes, sheets and blankets)?: A Little Difficulty moving from lying on back to sitting on the side of the bed? : Unable Difficulty sitting down on and standing up from a chair with arms (e.g., wheelchair, bedside commode, etc,.)?: Unable Help needed moving to and from a bed to chair (including a wheelchair)?: A Little Help needed walking in hospital room?: A Little Help needed climbing 3-5 steps with a railing? : A Lot 6 Click Score: 13    End of Session Equipment Utilized During Treatment: Gait belt Activity Tolerance: Patient tolerated treatment well Patient left: in chair;with call bell/phone within reach Nurse Communication: Mobility status;Other (comment)(pt had episode of incontinence ) PT Visit Diagnosis: Other abnormalities of gait and mobility (R26.89);Difficulty in walking, not elsewhere classified (R26.2);Unsteadiness on feet (R26.81)    Time: 1353-1420 PT Time Calculation (min) (ACUTE ONLY): 27 min   Charges:   PT Evaluation $PT Eval Low  Complexity: 1 Low PT Treatments $Therapeutic Activity: 8-22 mins   PT G Codes:        Leighton Ruff, PT, DPT  Acute Rehabilitation Services  Pager: [phone]   Rudean Hitt 03/29/2018, 3:26 PM

## 2018-03-29 NOTE — Plan of Care (Signed)

## 2018-03-29 NOTE — Anesthesia Procedure Notes (Signed)
Procedure Name: MAC Date/Time: 03/29/2018 7:35 AM Performed by: Jenne Campus, CRNA Pre-anesthesia Checklist: Patient identified, Emergency Drugs available, Suction available, Patient being monitored and Timeout performed Oxygen Delivery Method: Simple face mask

## 2018-03-29 NOTE — Progress Notes (Signed)
Orthopedic Tech Progress Note Patient Details:  Jeffrey Mahoney 04/20/1941 [id number]  CPM Right Knee CPM Right Knee: On Right Knee Flexion (Degrees): 90 Right Knee Extension (Degrees): 0 Additional Comments: foot roll  Post Interventions Patient Tolerated: Well Instructions Provided: Care of device, Adjustment of device  [name] 03/29/2018, 10:26 AM

## 2018-03-29 NOTE — Anesthesia Procedure Notes (Signed)
Anesthesia Regional Block: Adductor canal block   Pre-Anesthetic Checklist: ,, timeout performed, Correct Patient, Correct Site, Correct Laterality, Correct Procedure, Correct Position, site marked, Risks and benefits discussed,  Surgical consent,  Pre-op evaluation,  At surgeon's request and post-op pain management  Laterality: Right  Prep: chloraprep       Needles:  Injection technique: Single-shot      Additional Needles:   Procedures:,,,, ultrasound used (permanent image in chart),,,,  Narrative:  Start time: 03/29/2018 6:55 AM End time: 03/29/2018 7:05 AM Injection made incrementally with aspirations every 5 mL.  Performed by: Personally  Anesthesiologist: Barnet Glasgow, MD

## 2018-03-29 NOTE — Anesthesia Procedure Notes (Signed)
Spinal  Patient location during procedure: OR Staffing Anesthesiologist: Shota Kohrs, MD Performed: anesthesiologist  Preanesthetic Checklist Completed: patient identified, site marked, surgical consent, pre-op evaluation, timeout performed, IV checked, risks and benefits discussed and monitors and equipment checked Spinal Block Patient position: sitting Prep: ChloraPrep and site prepped and draped Patient monitoring: heart rate, continuous pulse ox and blood pressure Approach: right paramedian Location: L3-4 Injection technique: single-shot Needle Needle type: Sprotte  Needle gauge: 24 G Needle length: 9 cm Assessment Sensory level: T8 Additional Notes Expiration date of kit checked and confirmed. Patient tolerated procedure well, without complications.       

## 2018-03-30 ENCOUNTER — Other Ambulatory Visit: Payer: Self-pay

## 2018-03-30 ENCOUNTER — Encounter (HOSPITAL_COMMUNITY): Payer: Self-pay | Admitting: Orthopedic Surgery

## 2018-03-30 MED ORDER — ASPIRIN 325 MG PO TBEC: 325.0000 mg | DELAYED_RELEASE_TABLET | Freq: Two times a day (BID) | ORAL | 0 refills | Status: DC

## 2018-03-30 MED ORDER — METHOCARBAMOL 500 MG PO TABS: 500.0000 mg | ORAL_TABLET | Freq: Four times a day (QID) | ORAL | 0 refills | Status: DC | PRN

## 2018-03-30 MED ORDER — OXYCODONE HCL 10 MG PO TABS: 10.0000 mg | ORAL_TABLET | ORAL | 0 refills | Status: DC | PRN

## 2018-03-30 NOTE — Progress Notes (Signed)
Physical Therapy Treatment Patient Details Name: Jeffrey Mahoney MRN: [id number] DOB: 03-Oct-1941 Today's Date: 03/30/2018    History of Present Illness Pt is a 77 y/o male s/p elective R TKA. PMH includes COPD, HTN, and back surgery.     PT Comments    Pt received up in bed ready to participate in PT.  Pt participated in gait training, transfers, and therapeutic exercises.  During gait he is still unsteady but progressed to walk twice the distance before needing a seated rest break.  While on 3L [place] of O2 SPO2 desaturated to 88% during exertion but returned to 94% during seated rest break.  Pt remains to need improvement in strength and function before returning home so they plan for ST rehab.  Emphasize continuing education on safety/technique for gait training in future sessions.    Follow Up Recommendations  Follow surgeon's recommendation for DC plan and follow-up therapies;Supervision for mobility/OOB     Equipment Recommendations  None recommended by PT    Recommendations for Other Services       Precautions / Restrictions Precautions Precautions: Knee Precaution Booklet Issued: Yes (comment) Precaution Comments: Reviewed knee precautions and supine HEP.  Restrictions Weight Bearing Restrictions: Yes RLE Weight Bearing: Weight bearing as tolerated    Mobility  Bed Mobility         Supine to sit: Modified independent (Device/Increase time)        Transfers Overall transfer level: Needs assistance Equipment used: Rolling walker (2 wheeled) Transfers: Sit to/from Stand Sit to Stand: Min guard Stand pivot transfers: Min assist       General transfer comment: Cues for hand placement to and from seated surface as patient attempts to pull on RWvs. pushing from seated surface.    Ambulation/Gait Ambulation/Gait assistance: Min assist Ambulation Distance (Feet): 150 Feet Assistive device: Rolling walker (2 wheeled) Gait Pattern/deviations: Step-to  pattern;Step-through pattern;Decreased stance time - right;Antalgic;Trunk flexed Gait velocity: decreased.  pt gets impulsive and starts to speed up at risk of safety   General Gait Details: Pt with decreased stride on R and short stance time on R.   intially dragging R foot to L.  Multiple cues to correct gait sequencing.   Pt desaturated on room air to 88% before exertion.  Pt on 3L Warm [place] O2 during gait and exercise.  Pt desaturated on O2 during gait to 88% and went back up to 94% with seated rest break.     Stairs             Wheelchair Mobility    Modified Rankin (Stroke Patients Only)       Balance Overall balance assessment: Needs assistance Sitting-balance support: No upper extremity supported;Feet supported       Standing balance support: Bilateral upper extremity supported;During functional activity   Standing balance comment: Reliant on BUE support.                             Cognition Arousal/Alertness: Awake/alert Behavior During Therapy: WFL for tasks assessed/performed Overall Cognitive Status: Within Functional Limits for tasks assessed                                        Exercises General Exercises - Lower Extremity Ankle Circles/Pumps: AROM;Both;20 reps;Supine Quad Sets: AROM;Right;10 reps;Supine Short Arc Quad: AROM;Right;10 reps;Supine Long Arc Quad: AROM;Right;Supine;10 reps Heel Slides: AROM;Right;10  reps;Supine Hip ABduction/ADduction: AROM;Right;10 reps;Supine Straight Leg Raises: AROM;Right;10 reps;Supine    General Comments        Pertinent Vitals/Pain Pain Assessment: Faces Faces Pain Scale: Hurts a little bit Pain Location: R knee  Pain Descriptors / Indicators: Aching;Operative site guarding Pain Intervention(s): Monitored during session;Repositioned    Home Living                      Prior Function            PT Goals (current goals can now be found in the care plan section) Acute  Rehab PT Goals Patient Stated Goal: to go to rehab before going home  PT Goal Formulation: With patient Time For Goal Achievement: 04/12/18 Potential to Achieve Goals: Good    Frequency    7X/week      PT Plan Current plan remains appropriate    Co-evaluation              AM-PAC PT "6 Clicks" Daily Activity  Outcome Measure  Difficulty turning over in bed (including adjusting bedclothes, sheets and blankets)?: A Little Difficulty moving from lying on back to sitting on the side of the bed? : A Little Difficulty sitting down on and standing up from a chair with arms (e.g., wheelchair, bedside commode, etc,.)?: A Little Help needed moving to and from a bed to chair (including a wheelchair)?: A Little Help needed walking in hospital room?: A Little Help needed climbing 3-5 steps with a railing? : A Lot 6 Click Score: 17    End of Session Equipment Utilized During Treatment: Gait belt;Oxygen(2L at rest, 3L during exertion) Activity Tolerance: Patient tolerated treatment well Patient left: in chair;with call bell/phone within reach Nurse Communication: Mobility status PT Visit Diagnosis: Other abnormalities of gait and mobility (R26.89);Difficulty in walking, not elsewhere classified (R26.2);Unsteadiness on feet (R26.81)     Time: 3016-0109 PT Time Calculation (min) (ACUTE ONLY): 34 min  Charges:  $Gait Training: 8-22 mins $Therapeutic Activity: 8-22 mins                    G Codes:       Terri Skains, SPTA [phone]    Terri Skains 03/30/2018, 5:18 PM

## 2018-03-30 NOTE — Progress Notes (Signed)
Physical Therapy Treatment Patient Details Name: EZARIAH NACE MRN: [id number] DOB: 04-27-1941 Today's Date: 03/30/2018    History of Present Illness Pt is a 77 y/o male s/p elective R TKA. PMH includes COPD, HTN, and back surgery.     PT Comments    Pt performed gait training, transfers and therapeutic exercises during session this am.  Pt is progressing well but remains to require min assistance for gait training as he is unsteady and has difficulty following commands to correct gait sequencing to improve function.  Plan remains for ST rehab at SNF to improve strength and function before returning home.  Plan next session for continued education on safety and sequencing regarding gait training.      Follow Up Recommendations  Follow surgeon's recommendation for DC plan and follow-up therapies;Supervision for mobility/OOB     Equipment Recommendations  None recommended by PT    Recommendations for Other Services       Precautions / Restrictions Precautions Precautions: Knee Precaution Booklet Issued: Yes (comment) Precaution Comments: Reviewed knee precautions and supine HEP.  Restrictions Weight Bearing Restrictions: Yes RLE Weight Bearing: Weight bearing as tolerated    Mobility  Bed Mobility               General bed mobility comments: Pt in recliner on arrival.    Transfers Overall transfer level: Needs assistance Equipment used: Rolling walker (2 wheeled) Transfers: Sit to/from Stand Sit to Stand: Min guard         General transfer comment: Cues for hand placement to and from seated surface as patient attempts to pull on RWvs. pushing from seated surface.    Ambulation/Gait Ambulation/Gait assistance: Min assist Ambulation Distance (Feet): 75 Feet Assistive device: Rolling walker (2 wheeled) Gait Pattern/deviations: Step-to pattern;Step-through pattern;Decreased stance time - right;Antalgic;Trunk flexed Gait velocity: decreased   General Gait Details:  Pt with decreased stride on R and short stance time on R.  pt inconsistent with step to and step through pattern and intially dragging R foot to L.  Multiple cues to correct gait sequencing.     Stairs             Wheelchair Mobility    Modified Rankin (Stroke Patients Only)       Balance Overall balance assessment: Needs assistance   Sitting balance-Leahy Scale: Good       Standing balance-Leahy Scale: Poor                              Cognition Arousal/Alertness: Awake/alert Behavior During Therapy: WFL for tasks assessed/performed Overall Cognitive Status: Within Functional Limits for tasks assessed                                        Exercises Total Joint Exercises Goniometric ROM: 4-91 degrees R knee General Exercises - Lower Extremity Ankle Circles/Pumps: AROM;Both;20 reps;Supine Quad Sets: AROM;Right;10 reps;Supine Short Arc Quad: AROM;Right;10 reps;Supine Long Arc Quad: AROM;Right;Supine;10 reps Heel Slides: AROM;Right;10 reps;Supine Hip ABduction/ADduction: AROM;Right;10 reps;Supine Straight Leg Raises: AROM;Right;10 reps;Supine    General Comments        Pertinent Vitals/Pain Pain Assessment: 0-10 Pain Score: 3  Pain Location: R knee  Pain Descriptors / Indicators: Aching;Operative site guarding Pain Intervention(s): Monitored during session;RN gave pain meds during session;Limited activity within patient's tolerance    Home Living  Prior Function            PT Goals (current goals can now be found in the care plan section) Acute Rehab PT Goals Patient Stated Goal: to go to rehab before going home  Potential to Achieve Goals: Good Progress towards PT goals: Progressing toward goals    Frequency    7X/week      PT Plan Current plan remains appropriate    Co-evaluation              AM-PAC PT "6 Clicks" Daily Activity  Outcome Measure  Difficulty turning over  in bed (including adjusting bedclothes, sheets and blankets)?: A Little Difficulty moving from lying on back to sitting on the side of the bed? : Unable Difficulty sitting down on and standing up from a chair with arms (e.g., wheelchair, bedside commode, etc,.)?: Unable Help needed moving to and from a bed to chair (including a wheelchair)?: A Little Help needed walking in hospital room?: A Little Help needed climbing 3-5 steps with a railing? : A Lot 6 Click Score: 13    End of Session Equipment Utilized During Treatment: Gait belt Activity Tolerance: Patient tolerated treatment well Patient left: in chair;with call bell/phone within reach Nurse Communication: Mobility status PT Visit Diagnosis: Other abnormalities of gait and mobility (R26.89);Difficulty in walking, not elsewhere classified (R26.2);Unsteadiness on feet (R26.81)     Time: 6962-9528 PT Time Calculation (min) (ACUTE ONLY): 23 min  Charges:  $Gait Training: 8-22 mins $Therapeutic Exercise: 8-22 mins                    G Codes:       Governor Rooks, PTA pager [place] 03/30/2018, 1:05 PM

## 2018-03-30 NOTE — Plan of Care (Signed)

## 2018-03-30 NOTE — Progress Notes (Addendum)
SPORTS MEDICINE AND JOINT REPLACEMENT  Lara Mulch, MD    Carlyon Shadow, PA-C [place], [place], [place]  [address]                             [phone]   PROGRESS NOTE  Subjective:  negative for Chest Pain  negative for Shortness of Breath  negative for Nausea/Vomiting   negative for Calf Pain  negative for Bowel Movement   Tolerating Diet: yes         Patient reports pain as 4 on 0-10 scale.    Objective: Vital signs in last 24 hours:    Patient Vitals for the past 24 hrs:  BP Temp Temp src Pulse Resp SpO2  03/30/18 0510 96/62 - - 82 - -  03/30/18 0445 (!) 83/61 97.9 F (36.6 C) Oral 78 - (!) 83 %  03/30/18 0017 97/74 (!) 97.5 F (36.4 C) Oral 75 - (!) 77 %  03/29/18 2010 104/65 98.2 F (36.8 C) Oral 73 - 93 %  03/29/18 1131 109/73 (!) 97.3 F (36.3 C) Oral 66 14 95 %  03/29/18 1108 - 97.7 F (36.5 C) - - - -  03/29/18 1054 112/77 - - 69 16 97 %  03/29/18 1039 108/84 - - 78 17 95 %  03/29/18 1024 105/81 - - 64 16 95 %  03/29/18 1009 108/79 - - 62 14 96 %  03/29/18 0954 102/70 - - 65 14 92 %  03/29/18 0936 94/67 - - 65 15 94 %  03/29/18 0923 92/63 - - 68 16 97 %  03/29/18 0922 91/61 (!) 97.5 F (36.4 C) - 67 11 98 %    @flow {1959:LAST@   Intake/Output from previous day:   04/15 0701 - 04/16 0700 In: 1250 [I.V.:1250] Out: 350 [Urine:300]   Intake/Output this shift:   No intake/output data recorded.   Intake/Output      04/15 0701 - 04/16 0700   I.V. (mL/kg) 1250 (13.5)   Total Intake(mL/kg) 1250 (13.5)   Urine (mL/kg/hr) 300 (0.1)   Blood 50   Total Output 350   Net +900          LABORATORY DATA: No results for input(s): WBC, HGB, HCT, PLT in the last 168 hours. No results for input(s): NA, K, CL, CO2, BUN, CREATININE, GLUCOSE, CALCIUM in the last 168 hours. No results found for: INR, PROTIME  Examination:  General appearance: alert, cooperative and no distress Extremities: extremities normal, atraumatic, no cyanosis or  edema  Wound Exam: clean, dry, intact   Drainage:  None: wound tissue dry  Motor Exam: Quadriceps and Hamstrings Intact  Sensory Exam: Superficial Peroneal, Deep Peroneal and Tibial normal   Assessment:    1 Day Post-Op  Procedure(s) (LRB): RIGHT TOTAL KNEE ARTHROPLASTY (Right)  ADDITIONAL DIAGNOSIS:  Active Problems:   S/P total knee replacement     Plan: Physical Therapy as ordered Weight Bearing as Tolerated (WBAT)  DVT Prophylaxis:  Aspirin  DISCHARGE PLAN: Skilled Nursing Facility/Rehab  DISCHARGE NEEDS: HHPT   Patient doing well. D/C to SNF tomorrow  Anticipated LOS equal to or greater than 2 midnights due to - Age 79 and older with one or more of the following:  - Obesity  - Expected need for hospital services (PT, OT, Nursing) required for safe  discharge  - Anticipated need for postoperative skilled nursing care or inpatient rehab  Donia Ast 03/30/2018, 6:53 AM

## 2018-03-30 NOTE — Care Management Note (Signed)
Case Management Note  Patient Details  Name: Jeffrey Mahoney MRN: [id number] Date of Birth: [birth year]  Subjective/Objective:    77 yr old gentleman s/p right total knee arthroplasty.               Action/Plan: Patient will go to [facility] for shortterm Rehab. Social Worker is aware.    Expected Discharge Date:    03/31/18              Expected Discharge Plan:  [place]  In-House Referral:  Clinical Social Work  Discharge planning Services  NA  Post Acute Care Choice:  NA Choice offered to:  NA  DME Arranged:  N/A, CPM DME Agency:  [employer]  HH Arranged:  NA HH Agency:  NA(preoperatively setup with [facility])  Status of Service:  Completed, signed off  If discussed at [employer], dates discussed:    Additional Comments:  Ninfa Meeker, RN 03/30/2018, 3:16 PM

## 2018-03-31 MED ORDER — ACETAMINOPHEN 325 MG PO TABS
650.0000 mg | ORAL_TABLET | Freq: Four times a day (QID) | ORAL | Status: DC | PRN
  Administered 2018-03-31 – 2018-04-01 (×2): 650 mg via ORAL
  Filled 2018-03-31 (×2): qty 2

## 2018-03-31 NOTE — NC FL2 (Signed)
[place] MEDICAID FL2 LEVEL OF CARE SCREENING TOOL     IDENTIFICATION  Patient Name: Jeffrey Mahoney Birthdate: Nov 02, 1941 Sex: male Admission Date (Current Location): 03/29/2018  [facility] and [place] Number:  [employer] and Address:  The . [facility], [place] [address], [place], [place] [address]      Provider Number: [id number]  Attending Physician Name and Address:  Vickey Huger, MD  Relative Name and Phone Number:  Yer Olivencia, spouse, [phone]    Current Level of Care: Hospital Recommended Level of Care: [place] Prior Approval Number:    Date Approved/Denied:   PASRR Number: [id number]  Discharge Plan: SNF    Current Diagnoses: Patient Active Problem List   Diagnosis Date Noted  . S/P total knee replacement 03/29/2018  . COPD (chronic obstructive pulmonary disease) ([place]) 03/27/2011    Orientation RESPIRATION BLADDER Height & Weight     Self, Time, Situation, Place  Normal Continent Weight: 204 lb 9.6 oz (92.8 kg) Height:  5\' 10"  (177.8 cm)  BEHAVIORAL SYMPTOMS/MOOD NEUROLOGICAL BOWEL NUTRITION STATUS      Continent Diet(See DC Summary)  AMBULATORY STATUS COMMUNICATION OF NEEDS Skin   Limited Assist Verbally Surgical wounds                       Personal Care Assistance Level of Assistance  Bathing, Feeding, Dressing, Total care Bathing Assistance: Maximum assistance Feeding assistance: Limited assistance Dressing Assistance: Limited assistance     Functional Limitations Info  Sight, Hearing, Speech Sight Info: Adequate Hearing Info: Adequate Speech Info: Adequate    SPECIAL CARE FACTORS FREQUENCY  PT (By licensed PT), OT (By licensed OT)     PT Frequency: 5x weekly OT Frequency: 5x weekly            Contractures      Additional Factors Info  Code Status, Allergies Code Status Info: Full  Allergies Info: No Known Allergies           Current Medications (03/31/2018):  This is the  current hospital active medication list Current Facility-Administered Medications  Medication Dose Route Frequency Provider Last Rate Last Dose  . alum & mag hydroxide-simeth (MAALOX/MYLANTA) 200-200-20 MG/5ML suspension 30 mL  30 mL Oral Q4H PRN Donia Ast, [place]      . aspirin EC tablet 325 mg  325 mg Oral BID Donia Ast, [place]   325 mg at 03/31/18 0813  . bisacodyl (DULCOLAX) EC tablet 5 mg  5 mg Oral Daily PRN Donia Ast, [place]      . diphenhydrAMINE (BENADRYL) 12.5 MG/5ML elixir 12.5-25 mg  12.5-25 mg Oral Q4H PRN Donia Ast, [place]      . docusate sodium (COLACE) capsule 100 mg  100 mg Oral BID Donia Ast, [place]   100 mg at 03/31/18 8338  . dutasteride (AVODART) capsule 0.5 mg  0.5 mg Oral QPC breakfast Donia Ast, [place]   0.5 mg at 03/31/18 2505  . fesoterodine (TOVIAZ) tablet 8 mg  8 mg Oral Daily Donia Ast, [place]   8 mg at 03/31/18 3976  . gabapentin (NEURONTIN) capsule 100 mg  100 mg Oral QHS PRN Donia Ast, [place]   100 mg at 03/30/18 2123  . HYDROmorphone (DILAUDID) injection 0.5-1 mg  0.5-1 mg Intravenous Q4H PRN Vickey Huger, MD      . levothyroxine (SYNTHROID, LEVOTHROID) tablet 125 mcg  125 mcg Oral QAC breakfast Donia Ast, [place]  125 mcg at 03/31/18 0813  . menthol-cetylpyridinium (CEPACOL) lozenge 3 mg  1 lozenge Oral PRN Donia Ast, PA       Or  . phenol [facility]) mouth spray 1 spray  1 spray Mouth/Throat PRN Donia Ast, [place]      . methocarbamol (ROBAXIN) tablet 500 mg  500 mg Oral Q6H PRN Donia Ast, PA   500 mg at 03/31/18 0500   Or  . methocarbamol (ROBAXIN) 500 mg in dextrose 5 % 50 mL IVPB  500 mg Intravenous Q6H PRN Donia Ast, PA      . metoCLOPramide (REGLAN) tablet 5-10 mg  5-10 mg Oral Q8H PRN Donia Ast, PA       Or  . metoCLOPramide (REGLAN) injection 5-10 mg  5-10 mg Intravenous Q8H PRN Donia Ast, [place]   10 mg at 03/30/18 1154  . metoprolol succinate  (TOPROL-XL) 24 hr tablet 12.5 mg  12.5 mg Oral QPC breakfast Donia Ast, [place]   12.5 mg at 03/31/18 3646  . ondansetron (ZOFRAN) tablet 4 mg  4 mg Oral Q6H PRN Donia Ast, [place]   4 mg at 03/30/18 1710   Or  . ondansetron (ZOFRAN) injection 4 mg  4 mg Intravenous Q6H PRN Donia Ast, [place]   4 mg at 03/30/18 0744  . oxyCODONE (Oxy IR/ROXICODONE) immediate release tablet 5-10 mg  5-10 mg Oral Q4H PRN Donia Ast, PA   10 mg at 03/31/18 0500  . pantoprazole (PROTONIX) EC tablet 40 mg  40 mg Oral Daily Donia Ast, [place]   40 mg at 03/31/18 0815  . rOPINIRole (REQUIP) tablet 4 mg  4 mg Oral QHS Donia Ast, [place]   4 mg at 03/30/18 2123  . senna-docusate (Senokot-S) tablet 1 tablet  1 tablet Oral QHS PRN Donia Ast, PA      . sodium phosphate (FLEET) 7-19 GM/118ML enema 1 enema  1 enema Rectal Once PRN Donia Ast, PA      . traMADol Veatrice Bourbon) tablet 50 mg  50 mg Oral Q6H Carlyon Shadow [place], [place]   50 mg at 03/30/18 1739  . zolpidem (AMBIEN) tablet 5 mg  5 mg Oral QHS PRN Donia Ast, [place]   5 mg at 03/30/18 2123     Discharge Medications: Please see discharge summary for a list of discharge medications.  Relevant Imaging Results:  Relevant Lab Results:   Additional Information SS#: [id number]  [place], LCSW

## 2018-03-31 NOTE — Social Work (Addendum)
CSW received Insurance [phone] for SNF placement.  CSW will f/u for disposition as CSW is awaiting to hear back from SNF.  1:35pm: SNF confirmed bed offer.  CSW will f/u for disposition.  Elissa Hefty, LCSW Clinical Social Worker [phone]

## 2018-03-31 NOTE — Progress Notes (Signed)
Physical Therapy Treatment Patient Details Name: Jeffrey Mahoney MRN: [id number] DOB: 01-05-1941 Today's Date: 03/31/2018    History of Present Illness Pt is a 77 y/o male s/p elective R TKA. PMH includes COPD, HTN, and back surgery.     PT Comments    Pt performed gait training and functional mobility followed by therapeutic exercises.  Pt remains limited due to R knee ROM and weakness and will benefit from skilled placement at SNF short term to improve strength and function.  Plan for d/c to rehab this pm and pt is waiting on transportation.      Follow Up Recommendations  Follow surgeon's recommendation for DC plan and follow-up therapies;Supervision for mobility/OOB     Equipment Recommendations  None recommended by PT    Recommendations for Other Services       Precautions / Restrictions Precautions Precautions: Knee Precaution Booklet Issued: Yes (comment) Precaution Comments: Reviewed knee precautions and supine HEP.  Restrictions Weight Bearing Restrictions: Yes RLE Weight Bearing: Weight bearing as tolerated    Mobility  Bed Mobility Overal bed mobility: Needs Assistance Bed Mobility: Supine to Sit     Supine to sit: Modified independent (Device/Increase time)     General bed mobility comments: Increased time due to pain  Transfers Overall transfer level: Needs assistance Equipment used: Rolling walker (2 wheeled) Transfers: Sit to/from Stand Sit to Stand: Min guard         General transfer comment: Min guard for safety.  Pt able to recall hand placement correctly.   Decreased knee flexion on R during transfer.    Ambulation/Gait Ambulation/Gait assistance: Min assist Ambulation Distance (Feet): 150 Feet Assistive device: Rolling walker (2 wheeled) Gait Pattern/deviations: Step-to pattern;Step-through pattern;Decreased stance time - right;Antalgic;Trunk flexed Gait velocity: decreased.  pt gets impulsive and starts to speed up at risk of safety    General Gait Details: Pt remains to present with step to and through pattern.  Educated on RW positioning and safety.  Pt with decreased R knee flexion in stance phase.     Stairs             Wheelchair Mobility    Modified Rankin (Stroke Patients Only)       Balance Overall balance assessment: Needs assistance   Sitting balance-Leahy Scale: Good       Standing balance-Leahy Scale: Poor Standing balance comment: Reliant on BUE support.                             Cognition Arousal/Alertness: Awake/alert Behavior During Therapy: WFL for tasks assessed/performed Overall Cognitive Status: Within Functional Limits for tasks assessed                                        Exercises Total Joint Exercises Ankle Circles/Pumps: AROM;Both;20 reps Quad Sets: AROM;Right;10 reps Heel Slides: AROM;Right;10 reps;Supine Hip ABduction/ADduction: AROM;Right;10 reps;Supine Straight Leg Raises: AROM;Right;10 reps;Supine Long Arc Quad: Right;10 reps;Supine Goniometric ROM: 4-90 degrees R knee.      General Comments        Pertinent Vitals/Pain Pain Assessment: 0-10 Pain Score: 8  Pain Location: R knee  Pain Descriptors / Indicators: Aching;Operative site guarding Pain Intervention(s): Monitored during session;Repositioned;Limited activity within patient's tolerance;Ice applied;Patient requesting pain meds-RN notified;RN gave pain meds during session    Home Living  Prior Function            PT Goals (current goals can now be found in the care plan section) Acute Rehab PT Goals Patient Stated Goal: to go to rehab before going home  Potential to Achieve Goals: Good Progress towards PT goals: Progressing toward goals    Frequency    7X/week      PT Plan Current plan remains appropriate    Co-evaluation              AM-PAC PT "6 Clicks" Daily Activity  Outcome Measure  Difficulty turning over in  bed (including adjusting bedclothes, sheets and blankets)?: None Difficulty moving from lying on back to sitting on the side of the bed? : A Little Difficulty sitting down on and standing up from a chair with arms (e.g., wheelchair, bedside commode, etc,.)?: A Little Help needed moving to and from a bed to chair (including a wheelchair)?: A Little Help needed walking in hospital room?: A Little Help needed climbing 3-5 steps with a railing? : A Little 6 Click Score: 19    End of Session Equipment Utilized During Treatment: Gait belt Activity Tolerance: Patient tolerated treatment well Patient left: in chair;with call bell/phone within reach Nurse Communication: Mobility status PT Visit Diagnosis: Other abnormalities of gait and mobility (R26.89);Difficulty in walking, not elsewhere classified (R26.2);Unsteadiness on feet (R26.81)     Time: 0626-9485 PT Time Calculation (min) (ACUTE ONLY): 21 min  Charges:  $Gait Training: 8-22 mins                    G Codes:       Governor Rooks, PTA pager [phone]    Cristela Blue 03/31/2018, 3:24 PM

## 2018-03-31 NOTE — Clinical Social Work Note (Signed)
Clinical Social Work Assessment  Patient Details  Name: Jeffrey Mahoney MRN: [id number] Date of Birth: Mar 15, 1941  Date of referral:  03/31/18               Reason for consult:  Facility Placement                Permission sought to share information with:  Case Manager Permission granted to share information::  Yes, Verbal Permission Granted  Name::     Arther Dames  Agency::  SNF  Relationship::  spouse  Contact Information:     Housing/Transportation Living arrangements for the past 2 months:  No Name of Information:  Patient Patient Interpreter Needed:  None Criminal Activity/Legal Involvement Pertinent to Current Situation/Hospitalization:  No - Comment as needed Significant Relationships:  Spouse, Other Family Members, Adult Children Lives with:  Spouse Do you feel safe going back to the place where you live?  No Need for family participation in patient care:  No (Coment)  Care giving concerns:  Pt from home and has new impairment. Pt will need rehabilitative therapies.  Social Worker assessment / plan:  CSW met with patient at bedside to discuss SNF offers and placement. Pt indicated that he resides with spouse at home prior to impairment. Spouse has physical limitations and cannot care for him given her new injuries. Pt has prearranged to go to [facility] for SNF placement. CSW sent referral to SNF. CSW will f/u for Insurance Auth for SNF placement. CSW will continue disposition.  Employment status:  Retired Nurse, adult PT Recommendations:  [place] / Referral to community resources:  [place]  Patient/Family's Response to care:  Pt appreciative of assistance with SNF. Pt agreeable to SNF. No issues or concerns.  Patient/Family's Understanding of and Emotional Response to Diagnosis, Current Treatment, and Prognosis:  Patient has good understanding of diagnosis and prognosis and  agreeable to SNF as he pre-arranged for placement. Pt desires to return home after rehabilitative therapy with spouse. Pt has positive outlook and look forward to getting to rehab.  CSW will f/u for disposition.  Emotional Assessment Appearance:  Appears stated age Attitude/Demeanor/Rapport:  (Cooperative) Affect (typically observed):  Accepting, Appropriate Orientation:  Oriented to Self, Oriented to Place, Oriented to  Time, Oriented to Situation Alcohol / Substance use:  Not Applicable Psych involvement (Current and /or in the community):  No (Comment)  Discharge Needs  Concerns to be addressed:  Discharge Planning Concerns Readmission within the last 30 days:  No Current discharge risk:  Dependent with Mobility, Physical Impairment Barriers to Discharge:  No Barriers Identified   Normajean Baxter, LCSW 03/31/2018, 11:30 AM

## 2018-03-31 NOTE — Progress Notes (Signed)
Patient had a temp 101.4;doc on call ordered tylenol PRN and cancelled discharge due to fever.Pt.'s wife updated about the situation.

## 2018-03-31 NOTE — Progress Notes (Signed)
RN called and gave report to Nira Conn, RN at [facility] PT iv has been removed and all questions have been answered pt is comfortable and waiting D/C

## 2018-03-31 NOTE — Clinical Social Work Placement (Signed)
   CLINICAL SOCIAL WORK PLACEMENT  NOTE  Date:  03/31/2018  Patient Details  Name: Jeffrey Mahoney MRN: [id number] Date of Birth: [birth year]  Clinical Social Work is seeking post-discharge placement for this patient at the [place] level of care (*CSW will initial, date and re-position this form in  chart as items are completed):  Yes   Patient/family provided with [place] Work Department's list of facilities offering this level of care within the geographic area requested by the patient (or if unable, by the patient's family).  Yes   Patient/family informed of their freedom to choose among providers that offer the needed level of care, that participate in Medicare, Medicaid or managed care program needed by the patient, have an available bed and are willing to accept the patient.  Yes   Patient/family informed of [facility]'s ownership interest in [facility] and [facility], as well as of the fact that they are under no obligation to receive care at these facilities.  PASRR submitted to EDS on       PASRR number received on 03/31/18     Existing PASRR number confirmed on       FL2 transmitted to all facilities in geographic area requested by pt/family on 03/31/18     FL2 transmitted to all facilities within larger geographic area on       Patient informed that his/her managed care company has contracts with or will negotiate with certain facilities, including the following:        Yes   Patient/family informed of bed offers received.  Patient chooses bed at [place], [facility]     Physician recommends and patient chooses bed at      Patient to be transferred to [place] [place] on 03/31/18.  Patient to be transferred to facility by [facility]     Patient family notified on 03/31/18 of transfer.  Name of family member notified:  pt responsible for self     PHYSICIAN       Additional Comment:     _______________________________________________ Normajean Baxter, LCSW 03/31/2018, 1:37 PM

## 2018-03-31 NOTE — Discharge Summary (Addendum)
SPORTS MEDICINE & JOINT REPLACEMENT   Lara Mulch, MD   Carlyon Shadow, PA-C [place], [place], [place]  [address]                             [phone]  PATIENT ID: Jeffrey Mahoney        MRN:  [id number]          DOB/AGE: 1941/08/06 / 77 y.o.    DISCHARGE SUMMARY  ADMISSION DATE:    03/29/2018 DISCHARGE DATE:   04/02/2018  ADMISSION DIAGNOSIS: primary osteoarthritis right knee    DISCHARGE DIAGNOSIS:  primary osteoarthritis right knee    ADDITIONAL DIAGNOSIS: Active Problems:   S/P total knee replacement  Past Medical History:  Diagnosis Date  . Allergic rhinitis   . Aortic valve disorder    "it's enlarged; works fine" (03/30/2018)  . Arthritis    "knuckes, right knee" 9(03/30/2018)  . BPH (benign prostatic hypertrophy)   . Chest pain   . COPD (chronic obstructive pulmonary disease) ([place])   . Dyspnea   . GERD (gastroesophageal reflux disease)   . Headache    "used to have them alot; went away mostly after I quit smoking" (03/30/2018)  . Heart murmur   . History of blood transfusion 1949   "related to farm tractor accident"  . History of kidney stones    MORE THAN 10 YRS AGO  . Hypertension   . Hypothyroidism   . Hypothyroidism   . Pneumonia    "once" (03/30/2018)  . PONV (postoperative nausea and vomiting)   . Sleep apnea    DID NOT GO BACK FOR 2ND TEST.  NO MACHINE    PROCEDURE: Procedure(s): RIGHT TOTAL KNEE ARTHROPLASTY on 03/29/2018  CONSULTS:    HISTORY:  See H&P in chart  HOSPITAL COURSE:  FUMIO VANDAM is a 77 y.o. admitted on 03/29/2018 and found to have a diagnosis of primary osteoarthritis right knee.  After appropriate laboratory studies were obtained  they were taken to the operating room on 03/29/2018 and underwent Procedure(s): RIGHT TOTAL KNEE ARTHROPLASTY.   They were given perioperative antibiotics:  Anti-infectives (From admission, onward)   Start     Dose/Rate Route Frequency Ordered Stop   03/29/18 1330  ceFAZolin (ANCEF) IVPB  2g/100 mL premix     2 g 200 mL/hr over 30 Minutes Intravenous Every 6 hours 03/29/18 0933 03/29/18 2035   03/29/18 0539  ceFAZolin (ANCEF) IVPB 2g/100 mL premix     2 g 200 mL/hr over 30 Minutes Intravenous On call to O.R. 03/29/18 7353 03/29/18 0740    .  Patient given tranexamic acid IV or topical and exparel intra-operatively.  Tolerated the procedure well.    POD# 1: Vital signs were stable.  Patient denied Chest pain, shortness of breath, or calf pain.  Patient was started on Lovenox 30 mg subcutaneously twice daily at 8am.  Consults to PT, OT, and care management were made.  The patient was weight bearing as tolerated.  CPM was placed on the operative leg 0-90 degrees for 6-8 hours a day. When out of the CPM, patient was placed in the foam block to achieve full extension. Incentive spirometry was taught.  Dressing was changed.       POD #2, Continued  PT for ambulation and exercise program.  IV saline locked.  O2 discontinued.    The remainder of the hospital course was dedicated to ambulation and strengthening.  The patient was discharged on 2 Days Post-Op in  Good condition.  Blood products given:none  DIAGNOSTIC STUDIES: Recent vital signs:  Patient Vitals for the past 24 hrs:  BP Temp Temp src Pulse Resp SpO2  03/31/18 0659 (!) 131/94 98.5 F (36.9 C) Oral 87 18 100 %  03/30/18 2209 120/86 99 F (37.2 C) Oral 94 16 (!) 87 %  03/30/18 1225 126/78 98.1 F (36.7 C) Oral 93 16 (!) 87 %       Recent laboratory studies: No results for input(s): WBC, HGB, HCT, PLT in the last 168 hours. No results for input(s): NA, K, CL, CO2, BUN, CREATININE, GLUCOSE, CALCIUM in the last 168 hours. No results found for: INR, PROTIME   Recent Radiographic Studies :  No results found.  DISCHARGE INSTRUCTIONS: Discharge Instructions    CPM   Complete by:  As directed    Continuous passive motion machine (CPM):      Use the CPM from 0 to 90 for 4-6 hours per day.      You may  increase by 10 per day.  You may break it up into 2 or 3 sessions per day.      Use CPM for 10 weeks or until you are told to stop.   Call MD / Call 911   Complete by:  As directed    If you experience chest pain or shortness of breath, CALL 911 and be transported to the hospital emergency room.  If you develope a fever above 101 F, pus (white drainage) or increased drainage or redness at the wound, or calf pain, call your surgeon's office.   Constipation Prevention   Complete by:  As directed    Drink plenty of fluids.  Prune juice may be helpful.  You may use a stool softener, such as Colace (over the counter) 100 mg twice a day.  Use MiraLax (over the counter) for constipation as needed.   Diet - low sodium heart healthy   Complete by:  As directed    Discharge instructions   Complete by:  As directed    INSTRUCTIONS AFTER JOINT REPLACEMENT   Remove items at home which could result in a fall. This includes throw rugs or furniture in walking pathways ICE to the affected joint every three hours while awake for 30 minutes at a time, for at least the first 3-5 days, and then as needed for pain and swelling.  Continue to use ice for pain and swelling. You may notice swelling that will progress down to the foot and ankle.  This is normal after surgery.  Elevate your leg when you are not up walking on it.   Continue to use the breathing machine you got in the hospital (incentive spirometer) which will help keep your temperature down.  It is common for your temperature to cycle up and down following surgery, especially at night when you are not up moving around and exerting yourself.  The breathing machine keeps your lungs expanded and your temperature down.   DIET:  As you were doing prior to hospitalization, we recommend a well-balanced diet.  DRESSING / WOUND CARE / SHOWERING  Keep the surgical dressing until follow up.  The dressing is water proof, so you can shower without any extra covering.   IF THE DRESSING FALLS OFF or the wound gets wet inside, change the dressing with sterile gauze.  Please use good hand washing techniques before changing the dressing.  Do not use  any lotions or creams on the incision until instructed by your surgeon.    ACTIVITY  Increase activity slowly as tolerated, but follow the weight bearing instructions below.   No driving for 6 weeks or until further direction given by your physician.  You cannot drive while taking narcotics.  No lifting or carrying greater than 10 lbs. until further directed by your surgeon. Avoid periods of inactivity such as sitting longer than an hour when not asleep. This helps prevent blood clots.  You may return to work once you are authorized by your doctor.     WEIGHT BEARING   Weight bearing as tolerated with assist device (walker, cane, etc) as directed, use it as long as suggested by your surgeon or therapist, typically at least 4-6 weeks.   EXERCISES  Results after joint replacement surgery are often greatly improved when you follow the exercise, range of motion and muscle strengthening exercises prescribed by your doctor. Safety measures are also important to protect the joint from further injury. Any time any of these exercises cause you to have increased pain or swelling, decrease what you are doing until you are comfortable again and then slowly increase them. If you have problems or questions, call your caregiver or physical therapist for advice.   Rehabilitation is important following a joint replacement. After just a few days of immobilization, the muscles of the leg can become weakened and shrink (atrophy).  These exercises are designed to build up the tone and strength of the thigh and leg muscles and to improve motion. Often times heat used for twenty to thirty minutes before working out will loosen up your tissues and help with improving the range of motion but do not use heat for the first two weeks following  surgery (sometimes heat can increase post-operative swelling).   These exercises can be done on a training (exercise) mat, on the floor, on a table or on a bed. Use whatever works the best and is most comfortable for you.    Use music or television while you are exercising so that the exercises are a pleasant break in your day. This will make your life better with the exercises acting as a break in your routine that you can look forward to.   Perform all exercises about fifteen times, three times per day or as directed.  You should exercise both the operative leg and the other leg as well.   Exercises include:   Quad Sets - Tighten up the muscle on the front of the thigh (Quad) and hold for 5-10 seconds.   Straight Leg Raises - With your knee straight (if you were given a brace, keep it on), lift the leg to 60 degrees, hold for 3 seconds, and slowly lower the leg.  Perform this exercise against resistance later as your leg gets stronger.  Leg Slides: Lying on your back, slowly slide your foot toward your buttocks, bending your knee up off the floor (only go as far as is comfortable). Then slowly slide your foot back down until your leg is flat on the floor again.  Angel Wings: Lying on your back spread your legs to the side as far apart as you can without causing discomfort.  Hamstring Strength:  Lying on your back, push your heel against the floor with your leg straight by tightening up the muscles of your buttocks.  Repeat, but this time bend your knee to a comfortable angle, and push your heel against the floor.  You may put a pillow under the heel to make it more comfortable if necessary.   A rehabilitation program following joint replacement surgery can speed recovery and prevent re-injury in the future due to weakened muscles. Contact your doctor or a physical therapist for more information on knee rehabilitation.    CONSTIPATION  Constipation is defined medically as fewer than three stools  per week and severe constipation as less than one stool per week.  Even if you have a regular bowel pattern at home, your normal regimen is likely to be disrupted due to multiple reasons following surgery.  Combination of anesthesia, postoperative narcotics, change in appetite and fluid intake all can affect your bowels.   YOU MUST use at least one of the following options; they are listed in order of increasing strength to get the job done.  They are all available over the counter, and you may need to use some, POSSIBLY even all of these options:    Drink plenty of fluids (prune juice may be helpful) and high fiber foods Colace 100 mg by mouth twice a day  Senokot for constipation as directed and as needed Dulcolax (bisacodyl), take with full glass of water  Miralax (polyethylene glycol) once or twice a day as needed.  If you have tried all these things and are unable to have a bowel movement in the first 3-4 days after surgery call either your surgeon or your primary doctor.    If you experience loose stools or diarrhea, hold the medications until you stool forms back up.  If your symptoms do not get better within 1 week or if they get worse, check with your doctor.  If you experience "the worst abdominal pain ever" or develop nausea or vomiting, please contact the office immediately for further recommendations for treatment.   ITCHING:  If you experience itching with your medications, try taking only a single pain pill, or even half a pain pill at a time.  You can also use Benadryl over the counter for itching or also to help with sleep.   TED HOSE STOCKINGS:  Use stockings on both legs until for at least 2 weeks or as directed by physician office. They may be removed at night for sleeping.  MEDICATIONS:  See your medication summary on the "After Visit Summary" that nursing will review with you.  You may have some home medications which will be placed on hold until you complete the course of  blood thinner medication.  It is important for you to complete the blood thinner medication as prescribed.  PRECAUTIONS:  If you experience chest pain or shortness of breath - call 911 immediately for transfer to the hospital emergency department.   If you develop a fever greater that 101 F, purulent drainage from wound, increased redness or drainage from wound, foul odor from the wound/dressing, or calf pain - CONTACT YOUR SURGEON.                                                   FOLLOW-UP APPOINTMENTS:  If you do not already have a post-op appointment, please call the office for an appointment to be seen by your surgeon.  Guidelines for how soon to be seen are listed in your "After Visit Summary", but are typically between 1-4 weeks after surgery.  OTHER INSTRUCTIONS:  Knee Replacement:  Do not place pillow under knee, focus on keeping the knee straight while resting. CPM instructions: 0-90 degrees, 2 hours in the morning, 2 hours in the afternoon, and 2 hours in the evening. Place foam block, curve side up under heel at all times except when in CPM or when walking.  DO NOT modify, tear, cut, or change the foam block in any way.  MAKE SURE YOU:  Understand these instructions.  Get help right away if you are not doing well or get worse.    Thank you for letting us be a part of your medical care team.  It is a privilege we respect greatly.  We hope these instructions will help you stay on track for a fast and full recovery!   Increase activity slowly as tolerated   Complete by:  As directed       DISCHARGE MEDICATIONS:   Allergies as of 03/31/2018   No Known Allergies     Medication List    STOP taking these medications   cyclobenzaprine 5 MG tablet Commonly known as:  FLEXERIL   EXCEDRIN EXTRA STRENGTH PO   meloxicam 15 MG tablet Commonly known as:  MOBIC     TAKE these medications   acetaminophen 500 MG tablet Commonly known as:  TYLENOL Take 500-1,000 mg by mouth every  6 (six) hours as needed (for pain.).   aspirin 325 MG EC tablet Take 1 tablet (325 mg total) by mouth 2 (two) times daily. What changed:    medication strength  how much to take  when to take this   cetirizine 10 MG tablet Commonly known as:  ZYRTEC Take 10 mg by mouth daily after breakfast.   dutasteride 0.5 MG capsule Commonly known as:  AVODART Take 0.5 mg by mouth daily after breakfast.   gabapentin 100 MG capsule Commonly known as:  NEURONTIN Take 100-200 mg by mouth at bedtime as needed (for pain.).   ketoconazole 2 % shampoo Commonly known as:  NIZORAL Apply 1 application topically daily as needed. For scalp/dandruff   levothyroxine 125 MCG tablet Commonly known as:  SYNTHROID, LEVOTHROID Take 125 mcg by mouth daily before breakfast.   Magnesium 500 MG Caps Take 500 mg by mouth 3 (three) times a week.   methocarbamol 500 MG tablet Commonly known as:  ROBAXIN Take 1-2 tablets (500-1,000 mg total) by mouth every 6 (six) hours as needed for muscle spasms.   metoprolol succinate 25 MG 24 hr tablet Commonly known as:  TOPROL-XL Take 12.5 mg by mouth daily after breakfast.   nystatin cream Commonly known as:  MYCOSTATIN Apply 1 application topically 2 (two) times daily as needed. For affected areas of the face--to be mix with triamcinolone cream   omeprazole 40 MG capsule Commonly known as:  PRILOSEC Take 40 mg by mouth daily before breakfast.   Oxycodone HCl 10 MG Tabs Take 1 tablet (10 mg total) by mouth every 4 (four) hours as needed (pain score 4-6).   rOPINIRole 4 MG tablet Commonly known as:  REQUIP Take 4 mg by mouth at bedtime.   tolterodine 4 MG 24 hr capsule Commonly known as:  DETROL LA Take 4 mg by mouth daily after breakfast.   traMADol 50 MG tablet Commonly known as:  ULTRAM Take 50-100 mg by mouth every 6 (six) hours as needed (for pain.).   triamcinolone cream 0.1 % Commonly known as:  KENALOG Apply 1 application topically 2 (two)  times daily as needed. For  rash/dry scaly area on face. For affected areas of the face--to be mix with nystatin cream   Vitamin B-12 3000 MCG Subl Place 3,000 mcg under the tongue 2 (two) times daily after a meal.            Durable Medical Equipment  (From admission, onward)        Start     Ordered   03/29/18 1122  DME Walker rolling  Once    Question:  Patient needs a walker to treat with the following condition  Answer:  S/P total knee replacement   03/29/18 1121   03/29/18 1122  DME 3 n 1  Once     03/29/18 1121   03/29/18 1122  DME Bedside commode  Once    Question:  Patient needs a bedside commode to treat with the following condition  Answer:  S/P total knee replacement   03/29/18 1121      FOLLOW UP VISIT:    DISPOSITION: HOME VS. SNF  CONDITION:  Good   Donia Ast 03/31/2018, 10:58 AM

## 2018-03-31 NOTE — Social Work (Signed)
Clinical Social Worker facilitated patient discharge including contacting patient family and facility to confirm patient discharge plans.  Clinical information faxed to facility and family agreeable with plan.    CSW arranged ambulance transport via [facility] to [facility].    RN to call [phone] ext. [phone] to give report prior to discharge. Pt going to Room 709.  Clinical Social Worker will sign off for now as social work intervention is no longer needed. Please consult [place] again if new need arises.  Elissa Hefty, LCSW Clinical Social Worker [phone]

## 2018-03-31 NOTE — Social Work (Signed)
CSW received from [place] about [facility] as SNF called them. CSW validated the plan for patient to go to [facility] as CSW is sending referral to them now as CSW just met with patient.  CSW then called SNF and left message to confirm SNF bed offer from [facility].  CSW will f/u.  Elissa Hefty, LCSW Clinical Social Worker [phone]

## 2018-04-01 ENCOUNTER — Inpatient Hospital Stay (HOSPITAL_COMMUNITY): Payer: PPO

## 2018-04-01 LAB — GLUCOSE, CAPILLARY: GLUCOSE-CAPILLARY: 133 mg/dL — AB (ref 65–99)

## 2018-04-01 LAB — BLOOD GAS, ARTERIAL
ACID-BASE EXCESS: 8 mmol/L — AB (ref 0.0–2.0)
BICARBONATE: 32.6 mmol/L — AB (ref 20.0–28.0)
DRAWN BY: 519031
O2 Content: 2 L/min
O2 Saturation: 96 %
PH ART: 7.419 (ref 7.350–7.450)
PO2 ART: 76.7 mmHg — AB (ref 83.0–108.0)
Patient temperature: 98.6
pCO2 arterial: 51.4 mmHg — ABNORMAL HIGH (ref 32.0–48.0)

## 2018-04-01 NOTE — Social Work (Addendum)
Pt has SNF bed at [facility]. CSW awaiting updated dc summary if patient will dc to SNF today.  Floor RN advised of same.  CSW f/u on disposition.  12:50 pm When patient is medically ready, he will discharge to [facility]. CSW advised Olivia Mackie in admission of same.  CSW will continue to follow.  Elissa Hefty, LCSW Clinical Social Worker [phone]

## 2018-04-01 NOTE — Op Note (Signed)
TOTAL KNEE REPLACEMENT OPERATIVE NOTE:  03/29/2018  11:23 AM  PATIENT:  Jeffrey Mahoney  77 y.o. male  PRE-OPERATIVE DIAGNOSIS:  primary osteoarthritis right knee  POST-OPERATIVE DIAGNOSIS:  primary osteoarthritis right knee  PROCEDURE:  Procedure(s): RIGHT TOTAL KNEE ARTHROPLASTY  SURGEON:  Surgeon(s): Vickey Huger, MD  PHYSICIAN ASSISTANT: Carlyon Shadow, PAC /  ANESTHESIA:   spinal  DRAINS: Hemovac  SPECIMEN: None  COUNTS:  Correct  TOURNIQUET:   Total Tourniquet Time Documented: Thigh (Right) - 42 minutes Total: Thigh (Right) - 42 minutes   DICTATION:  Indication for procedure:    The patient is a 77 y.o. male who has failed conservative treatment for primary osteoarthritis right knee.  Informed consent was obtained prior to anesthesia. The risks versus benefits of the operation were explain and in a way the patient can, and did, understand.   On the implant demand matching protocol, this patient scored 10.  Therefore, this patient was not receive a polyethylene insert with vitamin E which is a high demand implant.  Description of procedure:     The patient was taken to the operating room and placed under anesthesia.  The patient was positioned in the usual fashion taking care that all body parts were adequately padded and/or protected.  I foley catheter was not placed.  A tourniquet was applied and the leg prepped and draped in the usual sterile fashion.  The extremity was exsanguinated with the esmarch and tourniquet inflated to 350 mmHg.  Pre-operative range of motion was normal.  The knee was in 5 degree of mild varus.  A midline incision approximately 6-7 inches long was made with a #10 blade.  A new blade was used to make a parapatellar arthrotomy going 2-3 cm into the quadriceps tendon, over the patella, and alongside the medial aspect of the patellar tendon.  A synovectomy was then performed with the #10 blade and forceps. I then elevated the deep MCL off the  medial tibial metaphysis subperiosteally around to the semimembranosus attachment.    I everted the patella and used calipers to measure patellar thickness.  I used the reamer to ream down to appropriate thickness to recreate the native thickness.  I then removed excess bone with the rongeur and sagittal saw.  I used the appropriately sized template and drilled the three lug holes.  I then put the trial in place and measured the thickness with the calipers to ensure recreation of the native thickness.  The trial was then removed and the patella subluxed and the knee brought into flexion.  A homan retractor was place to retract and protect the patella and lateral structures.  A Z-retractor was place medially to protect the medial structures.  The extra-medullary alignment system was used to make cut the tibial articular surface perpendicular to the anamotic axis of the tibia and in 3 degrees of posterior slope.  The cut surface and alignment jig was removed.  I then used the intramedullary alignment guide to make a 6 valgus cut on the distal femur.  I then marked out the epicondylar axis on the distal femur.  The posterior condylar axis measured 3 degrees.  I then used the anterior referencing sizer and measured the femur to be a size 9.  The 4-In-1 cutting block was screwed into place in external rotation matching the posterior condylar angle, making our cuts perpendicular to the epicondylar axis.  Anterior, posterior and chamfer cuts were made with the sagittal saw.  The cutting block and cut  pieces were removed.  A lamina spreader was placed in 90 degrees of flexion.  The ACL, PCL, menisci, and posterior condylar osteophytes were removed.  A 10 mm spacer blocked was found to offer good flexion and extension gap balance after mild in degree releasing.   The scoop retractor was then placed and the femoral finishing block was pinned in place.  The small sagittal saw was used as well as the lug drill to finish  the femur.  The block and cut surfaces were removed and the medullary canal hole filled with autograft bone from the cut pieces.  The tibia was delivered forward in deep flexion and external rotation.  A size F tray was selected and pinned into place centered on the medial 1/3 of the tibial tubercle.  The reamer and keel was used to prepare the tibia through the tray.    I then trialed with the size 9 femur, size F tibia, a 10 mm insert and the 38 patella.  I had excellent flexion/extension gap balance, excellent patella tracking.  Flexion was full and beyond 120 degrees; extension was zero.  These components were chosen and the staff opened them to me on the back table while the knee was lavaged copiously and the cement mixed.  The soft tissue was infiltrated with 60cc of exparel 1.3% through a 21 gauge needle.  I cemented in the components and removed all excess cement.  The polyethylene tibial component was snapped into place and the knee placed in extension while cement was hardening.  The capsule was infilltrated with 30cc of .25% Marcaine with epinephrine.  A hemovac was place in the joint exiting superolaterally.  A pain pump was place superomedially superficial to the arthrotomy.  Once the cement was hard, the tourniquet was let down.  Hemostasis was obtained.  The arthrotomy was closed with figure-8 #1 vicryl sutures.  The deep soft tissues were closed with #0 vicryls and the subcuticular layer closed with a running #2-0 vicryl.  The skin was reapproximated and closed with skin staples.  The wound was dressed with xeroform, 4 x4's, 2 ABD sponges, a single layer of webril and a TED stocking.   The patient was then awakened, extubated, and taken to the recovery room in stable condition.  BLOOD LOSS:  300cc DRAINS: 1 hemovac, 1 pain catheter COMPLICATIONS:  None.  PLAN OF CARE: Admit for overnight observation  PATIENT DISPOSITION:  PACU - hemodynamically stable.   Delay start of  Pharmacological VTE agent (>24hrs) due to surgical blood loss or risk of bleeding:  not applicable  Please fax a copy of this op note to my office at [phone] (please only include page 1 and 2 of the Case Information op note)

## 2018-04-01 NOTE — Progress Notes (Signed)
Physical Therapy Treatment Patient Details Name: Jeffrey Mahoney MRN: [id number] DOB: Nov 03, 1941 Today's Date: 04/01/2018    History of Present Illness Pt is a 77 y/o male s/p elective R TKA. PMH includes COPD, HTN, and back surgery.     PT Comments    Pt performed decreased activity from previous session and not able to progress to gait training in halls.  Pt presents with lethargy/grogginess that is new. SPO2 decreased to 74% on RA, applied 2L and required increased time to improve greater than 90%.  Pt encouraged to use incentive spirometer and required step by step instruction to perform x10 reps.  Asked patient to teach back technique and he was unable.  Pt able to answer all orientation questions correctly but is very slow to process and short term memory is impaired.  Called ortho PA and left message regarding patients presentation.    Follow Up Recommendations  Follow surgeon's recommendation for DC plan and follow-up therapies;Supervision for mobility/OOB     Equipment Recommendations  None recommended by PT    Recommendations for Other Services       Precautions / Restrictions Precautions Precautions: Knee Precaution Booklet Issued: Yes (comment) Restrictions Weight Bearing Restrictions: Yes RLE Weight Bearing: Weight bearing as tolerated    Mobility  Bed Mobility Overal bed mobility: Needs Assistance Bed Mobility: Supine to Sit     Supine to sit: Min assist     General bed mobility comments: Pt unable to achieve sitting without assistance to advance RLE to edge of bed.  Pt with eyes closed at time with increased time and effort needed to achieve sitting on the side of teh bed.    Transfers Overall transfer level: Needs assistance Equipment used: Rolling walker (2 wheeled) Transfers: Sit to/from Stand Sit to Stand: Mod assist         General transfer comment: Cues for hand placement to push from seated surface.  pt with significant change in assistance level  and required mod assistance to boost into standing.    Ambulation/Gait Ambulation/Gait assistance: Min assist Ambulation Distance (Feet): 8 Feet(from bed to chair a short distance away.  Pt is unable to advance gait further due to presentation on increased WOB, decreased O2 sats and lethargic/groggy presentation.  ) Assistive device: Rolling walker (2 wheeled) Gait Pattern/deviations: Step-to pattern;Decreased stance time - right;Antalgic;Trunk flexed Gait velocity: Severely decreased from previous session.     General Gait Details: Pt with short distance from bed to chair, pt presents with flexed posture and required cues for RW safety and he attempts to push device too far and leave device to the side of the chair vs pulling toward his body to back to the chair entirely.     Stairs             Wheelchair Mobility    Modified Rankin (Stroke Patients Only)       Balance Overall balance assessment: Needs assistance   Sitting balance-Leahy Scale: Good       Standing balance-Leahy Scale: Poor Standing balance comment: Reliant on BUE support.                             Cognition Arousal/Alertness: Lethargic;Suspect due to medications Behavior During Therapy: Flat affect Overall Cognitive Status: Impaired/Different from baseline Area of Impairment: Memory;Problem solving                     Memory: Decreased short-term memory  Problem Solving: Slow processing;Decreased initiation;Difficulty sequencing;Requires verbal cues;Requires tactile cues General Comments: Pt unable to recall things recently taught to him, he appears confused but able to answer all orientation questions correctly.        Exercises      General Comments        Pertinent Vitals/Pain Pain Assessment: 0-10 Pain Score: 2  Pain Location: R knee  Pain Descriptors / Indicators: Operative site guarding Pain Intervention(s): Monitored during session;Premedicated before  session    Home Living                      Prior Function            PT Goals (current goals can now be found in the care plan section) Acute Rehab PT Goals Patient Stated Goal: to go to rehab before going home  Potential to Achieve Goals: Good Progress towards PT goals: Progressing toward goals    Frequency    7X/week      PT Plan Current plan remains appropriate    Co-evaluation              AM-PAC PT "6 Clicks" Daily Activity  Outcome Measure  Difficulty turning over in bed (including adjusting bedclothes, sheets and blankets)?: Unable Difficulty moving from lying on back to sitting on the side of the bed? : Unable Difficulty sitting down on and standing up from a chair with arms (e.g., wheelchair, bedside commode, etc,.)?: Unable Help needed moving to and from a bed to chair (including a wheelchair)?: A Lot Help needed walking in hospital room?: A Little Help needed climbing 3-5 steps with a railing? : Total 6 Click Score: 9    End of Session Equipment Utilized During Treatment: Gait belt Activity Tolerance: Patient tolerated treatment well Patient left: in chair;with call bell/phone within reach Nurse Communication: Mobility status PT Visit Diagnosis: Other abnormalities of gait and mobility (R26.89);Difficulty in walking, not elsewhere classified (R26.2);Unsteadiness on feet (R26.81)     Time: 1010-1035 PT Time Calculation (min) (ACUTE ONLY): 25 min  Charges:  $Therapeutic Activity: 23-37 mins                    G CodesGovernor Rooks, PTA pager [phone]    Cristela Blue 04/01/2018, 11:07 AM

## 2018-04-01 NOTE — Plan of Care (Signed)

## 2018-04-01 NOTE — Significant Event (Signed)
Rapid Response Event Note  Overview: Time Called: 1205 Arrival Time: 1205 Event Type: Neurologic, Respiratory  Initial Focused Assessment: Patient SP right knee replacement 4/15  Per RN patient has been alert and oriented working well with PT.  This am he is groggy and O2 desat to 74% on RA.  Placed on 2L [place], O2 sat 96% Lung sounds clear,decreased bases, heart tones irregular  BP 103/66  HR 81 RR 12  O2 sat 96% Per RN patient voiding   Last dose of oxycodone, robaxin, neurontin  4/17 about 9pm   Interventions: PCXR ABG 12 lead EKG: SR with PACs RN spoke with PA, Kathlyn [place] updated on mental status change.  No orders received.  Plan of Care (if not transferred): Continue routine Vs Rn to call if patient deteriorates. Hold sedating medications  Event Summary: Name of Physician Notified: Carlyon Shadow at 1230    at          [place]

## 2018-04-02 DIAGNOSIS — S8990XA Unspecified injury of unspecified lower leg, initial encounter: Secondary | ICD-10-CM | POA: Diagnosis not present

## 2018-04-02 DIAGNOSIS — G8918 Other acute postprocedural pain: Secondary | ICD-10-CM | POA: Diagnosis not present

## 2018-04-02 DIAGNOSIS — M1711 Unilateral primary osteoarthritis, right knee: Secondary | ICD-10-CM | POA: Diagnosis not present

## 2018-04-02 DIAGNOSIS — R262 Difficulty in walking, not elsewhere classified: Secondary | ICD-10-CM | POA: Diagnosis not present

## 2018-04-02 DIAGNOSIS — Z471 Aftercare following joint replacement surgery: Secondary | ICD-10-CM | POA: Diagnosis not present

## 2018-04-02 DIAGNOSIS — E039 Hypothyroidism, unspecified: Secondary | ICD-10-CM | POA: Diagnosis not present

## 2018-04-02 DIAGNOSIS — J309 Allergic rhinitis, unspecified: Secondary | ICD-10-CM | POA: Diagnosis not present

## 2018-04-02 DIAGNOSIS — G8911 Acute pain due to trauma: Secondary | ICD-10-CM | POA: Diagnosis not present

## 2018-04-02 DIAGNOSIS — I358 Other nonrheumatic aortic valve disorders: Secondary | ICD-10-CM | POA: Diagnosis not present

## 2018-04-02 DIAGNOSIS — N400 Enlarged prostate without lower urinary tract symptoms: Secondary | ICD-10-CM | POA: Diagnosis not present

## 2018-04-02 DIAGNOSIS — Z96651 Presence of right artificial knee joint: Secondary | ICD-10-CM | POA: Diagnosis not present

## 2018-04-02 MED ORDER — SENNA 8.6 MG PO TABS: 1.0000 | ORAL_TABLET | Freq: Every day | ORAL | 0 refills | Status: AC

## 2018-04-02 MED ORDER — SENNA 8.6 MG PO TABS
1.0000 | ORAL_TABLET | Freq: Every day | ORAL | Status: DC
  Administered 2018-04-02: 8.6 mg via ORAL
  Filled 2018-04-02: qty 1

## 2018-04-02 NOTE — Progress Notes (Signed)
SPORTS MEDICINE AND JOINT REPLACEMENT  Lara Mulch, MD    Carlyon Shadow, PA-C [address], [place], [place]  [id number]                             [phone]   PROGRESS NOTE  Subjective:  negative for Chest Pain  negative for Shortness of Breath  negative for Nausea/Vomiting   negative for Calf Pain  negative for Bowel Movement   Tolerating Diet: yes         Patient reports pain as 3 on 0-10 scale.    Objective: Vital signs in last 24 hours:    Patient Vitals for the past 24 hrs:  BP Temp Temp src Pulse Resp SpO2  04/02/18 0523 100/67 98.1 F (36.7 C) Oral 78 - 93 %  04/01/18 2147 124/82 99.2 F (37.3 C) Oral 85 18 97 %  04/01/18 1208 103/66 - Oral 81 12 96 %  04/01/18 1038 100/63 98.3 F (36.8 C) Oral 86 11 92 %    @flow {1959:LAST@   Intake/Output from previous day:   04/18 0701 - 04/19 0700 In: 610 [P.O.:610] Out: 250 [Urine:250]   Intake/Output this shift:   No intake/output data recorded.   Intake/Output      04/18 0701 - 04/19 0700 04/19 0701 - 04/20 0700   P.O. 610    Total Intake(mL/kg) 610 (6.6)    Urine (mL/kg/hr) 250 (0.1)    Total Output 250    Net +360         Urine Occurrence 3 x       LABORATORY DATA: No results for input(s): WBC, HGB, HCT, PLT in the last 168 hours. No results for input(s): NA, K, CL, CO2, BUN, CREATININE, GLUCOSE, CALCIUM in the last 168 hours. No results found for: INR, PROTIME  Examination:  General appearance: alert, cooperative and no distress Extremities: extremities normal, atraumatic, no cyanosis or edema  Wound Exam: clean, dry, intact   Drainage:  None: wound tissue dry  Motor Exam: Quadriceps and Hamstrings Intact  Sensory Exam: Superficial Peroneal, Deep Peroneal and Tibial normal   Assessment:    4 Days Post-Op  Procedure(s) (LRB): RIGHT TOTAL KNEE ARTHROPLASTY (Right)  ADDITIONAL DIAGNOSIS:  Active Problems:   S/P total knee replacement     Plan: Physical Therapy as ordered  Weight Bearing as Tolerated (WBAT)  DVT Prophylaxis:  Aspirin  DISCHARGE PLAN: Skilled Nursing Facility/Rehab  DISCHARGE NEEDS: HHPT   Patient looks much better today. Labs and cxr reviewed. Patient still has not had a bowel movement. Planning on 1 PT session today and D/C to SNF         Donia Ast 04/02/2018, 7:15 AM

## 2018-04-02 NOTE — Plan of Care (Signed)

## 2018-04-02 NOTE — Clinical Social Work Placement (Addendum)
   CLINICAL SOCIAL WORK PLACEMENT  NOTE  Date:  04/02/2018  Patient Details  Name: Jeffrey Mahoney MRN: [id number] Date of Birth: Mar 09, 1941  Clinical Social Work is seeking post-discharge placement for this patient at the [place] level of care (*CSW will initial, date and re-position this form in  chart as items are completed):  Yes   Patient/family provided with [place] Work Department's list of facilities offering this level of care within the geographic area requested by the patient (or if unable, by the patient's family).  Yes   Patient/family informed of their freedom to choose among providers that offer the needed level of care, that participate in Medicare, Medicaid or managed care program needed by the patient, have an available bed and are willing to accept the patient.  Yes   Patient/family informed of [facility]'s ownership interest in [facility] and [facility], as well as of the fact that they are under no obligation to receive care at these facilities.  PASRR submitted to EDS on       PASRR number received on 03/31/18     Existing PASRR number confirmed on       FL2 transmitted to all facilities in geographic area requested by pt/family on 03/31/18     FL2 transmitted to all facilities within larger geographic area on       Patient informed that his/her managed care company has contracts with or will negotiate with certain facilities, including the following:        Yes   Patient/family informed of bed offers received.  Patient chooses bed at [facility], [facility]     Physician recommends and patient chooses bed at      Patient to be transferred to [place] on 04/02/18.  Patient to be transferred to facility by [facility]     Patient family notified on 04/02/18 of transfer.  Name of family member notified:  pt responsible for self, contact spouse [name] . Two attempts to reach pt's spouse--unsuccessful, however spoke  with pt's son [name] and he will get in touch with pt's spouse.    PHYSICIAN       Additional Comment:    _______________________________________________ Eileen Stanford, LCSW 04/02/2018, 11:30 AM

## 2018-04-02 NOTE — Progress Notes (Signed)
Physical Therapy Treatment Patient Details Name: Jeffrey Mahoney MRN: [id number] DOB: [birth year] Today's Date: 04/02/2018    History of Present Illness Pt is a 77 y/o male s/p elective R TKA. PMH includes COPD, HTN, and back surgery.     PT Comments    Pt received in the bathroom in process of heading back to bed.  Pt c/o having dizziness and nausea while alone in bathroom 15 minutes before PT session.  Pt desaturated on room air to 84% but went back up to 94% on 3L O2 [place] after seated rest break at EOB.  Reviewed knee precautions with pt and he could repeat them back clearly.  Moved to gait with pt being [employer] for safety and +2 for chair follow.  After gait pt was 98% on 3L O2 [place].  Discontinued session after pt c/o dizziness and became somewhat unresponsive/slow to respond.   Pt left in chair with nursing in the room and all needs met.  Emphasize continued gait and functional mobility to increase pts activity tolerance.     Follow Up Recommendations  Follow surgeon's recommendation for DC plan and follow-up therapies;Supervision for mobility/OOB     Equipment Recommendations  None recommended by PT    Recommendations for Other Services       Precautions / Restrictions Precautions Precautions: Knee Precaution Booklet Issued: Yes (comment) Precaution Comments: Reviewed knee precautions Restrictions Weight Bearing Restrictions: Yes RLE Weight Bearing: Weight bearing as tolerated    Mobility  Bed Mobility                  Transfers Overall transfer level: Needs assistance Equipment used: Rolling walker (2 wheeled) Transfers: Sit to/from Stand Sit to Stand: Min guard Stand pivot transfers: Supervision;Min guard       General transfer comment: Pt c/o of nausea/dizzyness 15 minutes before PT.  MinG/Supervision for safety.    Ambulation/Gait Ambulation/Gait assistance: Min guard;+2 safety/equipment(+2 for chair follow.) Ambulation Distance (Feet): (P) 150  Feet Assistive device: Rolling walker (2 wheeled) Gait Pattern/deviations: Step-to pattern;Decreased stance time - right;Trunk flexed Gait velocity: reduced   General Gait Details: Pt presents with flexed posture and neck. Pt doing much better at keeping himself inside of walker during gait.  Pt desatted on room air to 84% but went up to 94% 3L O2 [place] after seated rest break before gait.  SPO2 at 98% 3L O2 [place] after seated rest break after gait.  Pt on 2L O2 at rest and 3L O2 [place] during gait.      Stairs             Wheelchair Mobility    Modified Rankin (Stroke Patients Only)       Balance Overall balance assessment: Needs assistance Sitting-balance support: No upper extremity supported;Feet supported       Standing balance support: Bilateral upper extremity supported;During functional activity   Standing balance comment: Reliant on BUE support.                             Cognition Arousal/Alertness: Lethargic Behavior During Therapy: Flat affect Overall Cognitive Status: Impaired/Different from baseline Area of Impairment: Memory;Problem solving                     Memory: Decreased short-term memory       Problem Solving: Slow processing;Decreased initiation;Difficulty sequencing;Requires verbal cues;Requires tactile cues General Comments: Pt unable to recall things recently taught to him,  he appears confused.      Exercises Total Joint Exercises Long Arc Quad: AROM;Right;Seated;5 reps(Therapeutic Exercise discontinued due to pt dizzyness and being somewhat unresponsive/very slow to respond.  ) Goniometric ROM: Did not check due to ending session early but appears to be approx 90 degrees still.  Based on judgement watching LAQ's.    General Comments        Pertinent Vitals/Pain Pain Assessment: 0-10 Pain Score: 2  Pain Location: R knee  Pain Descriptors / Indicators: Operative site guarding Pain Intervention(s): Limited activity  within patient's tolerance;Repositioned;Monitored during session    Home Living Family/patient expects to be discharged to:: Skilled nursing facility Living Arrangements: Spouse/significant other             Additional Comments: Reports he is going to [facility] at d/c     Prior Function Level of Independence: Independent          PT Goals (current goals can now be found in the care plan section) Acute Rehab PT Goals Patient Stated Goal: to go to rehab before going home  PT Goal Formulation: With patient Time For Goal Achievement: 04/12/18 Potential to Achieve Goals: Good Progress towards PT goals: Progressing toward goals    Frequency    7X/week      PT Plan Current plan remains appropriate    Co-evaluation              AM-PAC PT "6 Clicks" Daily Activity  Outcome Measure      Difficulty sitting down on and standing up from a chair with arms (e.g., wheelchair, bedside commode, etc,.)?: A Little Help needed moving to and from a bed to chair (including a wheelchair)?: A Little Help needed walking in hospital room?: A Little   6 Click Score: 9    End of Session Equipment Utilized During Treatment: Gait belt;Oxygen(3L during gait/ex and 2L at rest.) Activity Tolerance: Patient tolerated treatment well Patient left: in chair;with call bell/phone within reach;with nursing/sitter in room Nurse Communication: Mobility status PT Visit Diagnosis: Other abnormalities of gait and mobility (R26.89);Difficulty in walking, not elsewhere classified (R26.2);Unsteadiness on feet (R26.81)     Time: 8337-4451 PT Time Calculation (min) (ACUTE ONLY): 38 min  Charges:  $Gait Training: 8-22 mins $Therapeutic Activity: 23-37 mins                    G Codes:       Terri Skains, SPTA [phone]    Terri Skains 04/02/2018, 12:33 PM

## 2018-04-02 NOTE — Clinical Social Work Note (Signed)
Clinical Social Worker facilitated patient discharge including contacting patient family and facility to confirm patient discharge plans.  Clinical information faxed to facility and family agreeable with plan.  CSW arranged ambulance transport via [facility] to [facility] in [place]--room 701.  RN to call [phone] ext: [phone] for report prior to discharge.  Clinical Social Worker will sign off for now as social work intervention is no longer needed. Please consult [place] again if new need arises.  [place], [place]

## 2018-04-05 DIAGNOSIS — R262 Difficulty in walking, not elsewhere classified: Secondary | ICD-10-CM | POA: Diagnosis not present

## 2018-04-05 DIAGNOSIS — G8918 Other acute postprocedural pain: Secondary | ICD-10-CM | POA: Diagnosis not present

## 2018-04-05 DIAGNOSIS — Z471 Aftercare following joint replacement surgery: Secondary | ICD-10-CM | POA: Diagnosis not present

## 2018-04-05 DIAGNOSIS — E039 Hypothyroidism, unspecified: Secondary | ICD-10-CM | POA: Diagnosis not present

## 2018-04-06 DIAGNOSIS — Z96651 Presence of right artificial knee joint: Secondary | ICD-10-CM

## 2018-04-06 HISTORY — DX: Presence of right artificial knee joint: Z96.651

## 2018-04-07 DIAGNOSIS — M25561 Pain in right knee: Secondary | ICD-10-CM | POA: Diagnosis not present

## 2018-04-07 DIAGNOSIS — R262 Difficulty in walking, not elsewhere classified: Secondary | ICD-10-CM | POA: Diagnosis not present

## 2018-04-07 DIAGNOSIS — M25661 Stiffness of right knee, not elsewhere classified: Secondary | ICD-10-CM | POA: Diagnosis not present

## 2018-04-07 DIAGNOSIS — M6281 Muscle weakness (generalized): Secondary | ICD-10-CM | POA: Diagnosis not present

## 2018-04-09 DIAGNOSIS — M25561 Pain in right knee: Secondary | ICD-10-CM | POA: Diagnosis not present

## 2018-04-09 DIAGNOSIS — R262 Difficulty in walking, not elsewhere classified: Secondary | ICD-10-CM | POA: Diagnosis not present

## 2018-04-09 DIAGNOSIS — M6281 Muscle weakness (generalized): Secondary | ICD-10-CM | POA: Diagnosis not present

## 2018-04-09 DIAGNOSIS — M25661 Stiffness of right knee, not elsewhere classified: Secondary | ICD-10-CM | POA: Diagnosis not present

## 2018-04-12 DIAGNOSIS — M25661 Stiffness of right knee, not elsewhere classified: Secondary | ICD-10-CM | POA: Diagnosis not present

## 2018-04-12 DIAGNOSIS — M25561 Pain in right knee: Secondary | ICD-10-CM | POA: Diagnosis not present

## 2018-04-12 DIAGNOSIS — R262 Difficulty in walking, not elsewhere classified: Secondary | ICD-10-CM | POA: Diagnosis not present

## 2018-04-12 DIAGNOSIS — M6281 Muscle weakness (generalized): Secondary | ICD-10-CM | POA: Diagnosis not present

## 2018-04-13 DIAGNOSIS — Z96651 Presence of right artificial knee joint: Secondary | ICD-10-CM | POA: Diagnosis not present

## 2018-04-14 DIAGNOSIS — M25561 Pain in right knee: Secondary | ICD-10-CM | POA: Diagnosis not present

## 2018-04-14 DIAGNOSIS — M25661 Stiffness of right knee, not elsewhere classified: Secondary | ICD-10-CM | POA: Diagnosis not present

## 2018-04-14 DIAGNOSIS — M6281 Muscle weakness (generalized): Secondary | ICD-10-CM | POA: Diagnosis not present

## 2018-04-14 DIAGNOSIS — R262 Difficulty in walking, not elsewhere classified: Secondary | ICD-10-CM | POA: Diagnosis not present

## 2018-04-16 DIAGNOSIS — R262 Difficulty in walking, not elsewhere classified: Secondary | ICD-10-CM | POA: Diagnosis not present

## 2018-04-16 DIAGNOSIS — M25661 Stiffness of right knee, not elsewhere classified: Secondary | ICD-10-CM | POA: Diagnosis not present

## 2018-04-16 DIAGNOSIS — M25561 Pain in right knee: Secondary | ICD-10-CM | POA: Diagnosis not present

## 2018-04-16 DIAGNOSIS — M6281 Muscle weakness (generalized): Secondary | ICD-10-CM | POA: Diagnosis not present

## 2018-04-19 DIAGNOSIS — M6281 Muscle weakness (generalized): Secondary | ICD-10-CM | POA: Diagnosis not present

## 2018-04-19 DIAGNOSIS — M25661 Stiffness of right knee, not elsewhere classified: Secondary | ICD-10-CM | POA: Diagnosis not present

## 2018-04-19 DIAGNOSIS — R262 Difficulty in walking, not elsewhere classified: Secondary | ICD-10-CM | POA: Diagnosis not present

## 2018-04-19 DIAGNOSIS — M25561 Pain in right knee: Secondary | ICD-10-CM | POA: Diagnosis not present

## 2018-04-21 ENCOUNTER — Encounter: Payer: Self-pay | Admitting: *Deleted

## 2018-04-21 ENCOUNTER — Other Ambulatory Visit: Payer: Self-pay | Admitting: *Deleted

## 2018-04-21 DIAGNOSIS — R262 Difficulty in walking, not elsewhere classified: Secondary | ICD-10-CM | POA: Diagnosis not present

## 2018-04-21 DIAGNOSIS — M25561 Pain in right knee: Secondary | ICD-10-CM | POA: Diagnosis not present

## 2018-04-21 DIAGNOSIS — M25661 Stiffness of right knee, not elsewhere classified: Secondary | ICD-10-CM | POA: Diagnosis not present

## 2018-04-21 DIAGNOSIS — M6281 Muscle weakness (generalized): Secondary | ICD-10-CM | POA: Diagnosis not present

## 2018-04-21 NOTE — Patient Outreach (Signed)
[place] [facility]) Care Management  04/21/2018  Jeffrey Mahoney JUN 02-20-1941 [id number]  Referral via [place];  Member discharged from inpatient -[place] 04/06/2018:  Telephone call to patient; left HIPPA compliant voice mail requesting call back.   Plan: Geophysicist/field seismologist. Follow up 2-4 business days.  Sherrin Daisy, RN BSN [place] Management Coordinator [facility] Care Management  [phone]

## 2018-04-23 ENCOUNTER — Other Ambulatory Visit: Payer: Self-pay | Admitting: *Deleted

## 2018-04-23 DIAGNOSIS — R262 Difficulty in walking, not elsewhere classified: Secondary | ICD-10-CM | POA: Diagnosis not present

## 2018-04-23 DIAGNOSIS — M6281 Muscle weakness (generalized): Secondary | ICD-10-CM | POA: Diagnosis not present

## 2018-04-23 DIAGNOSIS — M25561 Pain in right knee: Secondary | ICD-10-CM | POA: Diagnosis not present

## 2018-04-23 DIAGNOSIS — M25661 Stiffness of right knee, not elsewhere classified: Secondary | ICD-10-CM | POA: Diagnosis not present

## 2018-04-23 NOTE — Patient Outreach (Signed)
[place] [facility]) Care Management  04/23/2018  COAL NEARHOOD [birth year] [id number]  Referral via [place];  Member discharged from inpatient -[place] 04/06/2018:  Telephone call to patient to patient who was advised of reason for call.  Patient voices that he was recently in hospital for knee replacement & went to rehabilitation center for therapy. States discharged to home with spouse from [facility] 04/06/2018. States he is currently taking outpatient therapy and spouses drives him to session. Voices that he has seen primary care provider in follow up visit 4/30 and saw orthopedic MD 4/23. States he doing fine now. Voices he manages his own medications and understands the importance of taking medications as ordered.   Patient agreed to answer transition assessment questions but  declined to go over list of medications. States that was not necessary & voices that he did not need  [facility] services at this time.  Plan: Case closure.   Sherrin Daisy, RN BSN [place] Management Coordinator [facility] Care Management  [phone]

## 2018-04-26 DIAGNOSIS — M6281 Muscle weakness (generalized): Secondary | ICD-10-CM | POA: Diagnosis not present

## 2018-04-26 DIAGNOSIS — M25561 Pain in right knee: Secondary | ICD-10-CM | POA: Diagnosis not present

## 2018-04-26 DIAGNOSIS — R262 Difficulty in walking, not elsewhere classified: Secondary | ICD-10-CM | POA: Diagnosis not present

## 2018-04-26 DIAGNOSIS — M25661 Stiffness of right knee, not elsewhere classified: Secondary | ICD-10-CM | POA: Diagnosis not present

## 2018-04-30 DIAGNOSIS — R262 Difficulty in walking, not elsewhere classified: Secondary | ICD-10-CM | POA: Diagnosis not present

## 2018-04-30 DIAGNOSIS — M25661 Stiffness of right knee, not elsewhere classified: Secondary | ICD-10-CM | POA: Diagnosis not present

## 2018-04-30 DIAGNOSIS — M25561 Pain in right knee: Secondary | ICD-10-CM | POA: Diagnosis not present

## 2018-04-30 DIAGNOSIS — M6281 Muscle weakness (generalized): Secondary | ICD-10-CM | POA: Diagnosis not present

## 2018-05-05 DIAGNOSIS — M25561 Pain in right knee: Secondary | ICD-10-CM | POA: Diagnosis not present

## 2018-05-05 DIAGNOSIS — M6281 Muscle weakness (generalized): Secondary | ICD-10-CM | POA: Diagnosis not present

## 2018-05-05 DIAGNOSIS — R262 Difficulty in walking, not elsewhere classified: Secondary | ICD-10-CM | POA: Diagnosis not present

## 2018-05-05 DIAGNOSIS — M25661 Stiffness of right knee, not elsewhere classified: Secondary | ICD-10-CM | POA: Diagnosis not present

## 2018-05-07 DIAGNOSIS — M25661 Stiffness of right knee, not elsewhere classified: Secondary | ICD-10-CM | POA: Diagnosis not present

## 2018-05-07 DIAGNOSIS — R262 Difficulty in walking, not elsewhere classified: Secondary | ICD-10-CM | POA: Diagnosis not present

## 2018-05-07 DIAGNOSIS — M25561 Pain in right knee: Secondary | ICD-10-CM | POA: Diagnosis not present

## 2018-05-07 DIAGNOSIS — M6281 Muscle weakness (generalized): Secondary | ICD-10-CM | POA: Diagnosis not present

## 2018-05-11 DIAGNOSIS — M25661 Stiffness of right knee, not elsewhere classified: Secondary | ICD-10-CM | POA: Diagnosis not present

## 2018-05-11 DIAGNOSIS — M25561 Pain in right knee: Secondary | ICD-10-CM | POA: Diagnosis not present

## 2018-05-11 DIAGNOSIS — R262 Difficulty in walking, not elsewhere classified: Secondary | ICD-10-CM | POA: Diagnosis not present

## 2018-05-11 DIAGNOSIS — M6281 Muscle weakness (generalized): Secondary | ICD-10-CM | POA: Diagnosis not present

## 2018-05-13 DIAGNOSIS — M25561 Pain in right knee: Secondary | ICD-10-CM | POA: Diagnosis not present

## 2018-05-13 DIAGNOSIS — M6281 Muscle weakness (generalized): Secondary | ICD-10-CM | POA: Diagnosis not present

## 2018-05-13 DIAGNOSIS — M25661 Stiffness of right knee, not elsewhere classified: Secondary | ICD-10-CM | POA: Diagnosis not present

## 2018-05-13 DIAGNOSIS — R262 Difficulty in walking, not elsewhere classified: Secondary | ICD-10-CM | POA: Diagnosis not present

## 2018-05-19 DIAGNOSIS — M25561 Pain in right knee: Secondary | ICD-10-CM | POA: Diagnosis not present

## 2018-05-19 DIAGNOSIS — M6281 Muscle weakness (generalized): Secondary | ICD-10-CM | POA: Diagnosis not present

## 2018-05-19 DIAGNOSIS — M25661 Stiffness of right knee, not elsewhere classified: Secondary | ICD-10-CM | POA: Diagnosis not present

## 2018-05-19 DIAGNOSIS — R262 Difficulty in walking, not elsewhere classified: Secondary | ICD-10-CM | POA: Diagnosis not present

## 2018-05-21 DIAGNOSIS — M6281 Muscle weakness (generalized): Secondary | ICD-10-CM | POA: Diagnosis not present

## 2018-05-21 DIAGNOSIS — R262 Difficulty in walking, not elsewhere classified: Secondary | ICD-10-CM | POA: Diagnosis not present

## 2018-05-21 DIAGNOSIS — M25561 Pain in right knee: Secondary | ICD-10-CM | POA: Diagnosis not present

## 2018-05-21 DIAGNOSIS — M25661 Stiffness of right knee, not elsewhere classified: Secondary | ICD-10-CM | POA: Diagnosis not present

## 2018-05-24 DIAGNOSIS — R262 Difficulty in walking, not elsewhere classified: Secondary | ICD-10-CM | POA: Diagnosis not present

## 2018-05-24 DIAGNOSIS — M6281 Muscle weakness (generalized): Secondary | ICD-10-CM | POA: Diagnosis not present

## 2018-05-24 DIAGNOSIS — M25561 Pain in right knee: Secondary | ICD-10-CM | POA: Diagnosis not present

## 2018-05-24 DIAGNOSIS — M25661 Stiffness of right knee, not elsewhere classified: Secondary | ICD-10-CM | POA: Diagnosis not present

## 2018-05-27 DIAGNOSIS — M6281 Muscle weakness (generalized): Secondary | ICD-10-CM | POA: Diagnosis not present

## 2018-05-27 DIAGNOSIS — R262 Difficulty in walking, not elsewhere classified: Secondary | ICD-10-CM | POA: Diagnosis not present

## 2018-05-27 DIAGNOSIS — M25561 Pain in right knee: Secondary | ICD-10-CM | POA: Diagnosis not present

## 2018-05-27 DIAGNOSIS — M25661 Stiffness of right knee, not elsewhere classified: Secondary | ICD-10-CM | POA: Diagnosis not present

## 2018-05-31 DIAGNOSIS — R262 Difficulty in walking, not elsewhere classified: Secondary | ICD-10-CM | POA: Diagnosis not present

## 2018-05-31 DIAGNOSIS — M25661 Stiffness of right knee, not elsewhere classified: Secondary | ICD-10-CM | POA: Diagnosis not present

## 2018-05-31 DIAGNOSIS — M25561 Pain in right knee: Secondary | ICD-10-CM | POA: Diagnosis not present

## 2018-05-31 DIAGNOSIS — M6281 Muscle weakness (generalized): Secondary | ICD-10-CM | POA: Diagnosis not present

## 2018-06-03 DIAGNOSIS — M6281 Muscle weakness (generalized): Secondary | ICD-10-CM | POA: Diagnosis not present

## 2018-06-03 DIAGNOSIS — R262 Difficulty in walking, not elsewhere classified: Secondary | ICD-10-CM | POA: Diagnosis not present

## 2018-06-03 DIAGNOSIS — M25561 Pain in right knee: Secondary | ICD-10-CM | POA: Diagnosis not present

## 2018-06-03 DIAGNOSIS — M25661 Stiffness of right knee, not elsewhere classified: Secondary | ICD-10-CM | POA: Diagnosis not present

## 2018-06-25 ENCOUNTER — Other Ambulatory Visit: Payer: Self-pay | Admitting: Cardiothoracic Surgery

## 2018-06-25 DIAGNOSIS — R918 Other nonspecific abnormal finding of lung field: Secondary | ICD-10-CM

## 2018-07-15 DIAGNOSIS — R350 Frequency of micturition: Secondary | ICD-10-CM | POA: Diagnosis not present

## 2018-07-15 DIAGNOSIS — N401 Enlarged prostate with lower urinary tract symptoms: Secondary | ICD-10-CM | POA: Diagnosis not present

## 2018-07-15 DIAGNOSIS — E039 Hypothyroidism, unspecified: Secondary | ICD-10-CM | POA: Diagnosis not present

## 2018-07-15 DIAGNOSIS — I10 Essential (primary) hypertension: Secondary | ICD-10-CM | POA: Diagnosis not present

## 2018-07-15 DIAGNOSIS — I712 Thoracic aortic aneurysm, without rupture: Secondary | ICD-10-CM | POA: Diagnosis not present

## 2018-07-15 DIAGNOSIS — J439 Emphysema, unspecified: Secondary | ICD-10-CM | POA: Diagnosis not present

## 2018-07-26 DIAGNOSIS — G8929 Other chronic pain: Secondary | ICD-10-CM | POA: Insufficient documentation

## 2018-07-26 DIAGNOSIS — Z4789 Encounter for other orthopedic aftercare: Secondary | ICD-10-CM | POA: Diagnosis not present

## 2018-07-26 DIAGNOSIS — M25572 Pain in left ankle and joints of left foot: Secondary | ICD-10-CM | POA: Diagnosis not present

## 2018-07-26 DIAGNOSIS — M549 Dorsalgia, unspecified: Secondary | ICD-10-CM | POA: Diagnosis not present

## 2018-07-26 DIAGNOSIS — M2170 Unequal limb length (acquired), unspecified site: Secondary | ICD-10-CM | POA: Diagnosis not present

## 2018-07-26 DIAGNOSIS — M21072 Valgus deformity, not elsewhere classified, left ankle: Secondary | ICD-10-CM | POA: Diagnosis not present

## 2018-07-26 HISTORY — DX: Other chronic pain: G89.29

## 2018-07-28 ENCOUNTER — Ambulatory Visit
Admission: RE | Admit: 2018-07-28 | Discharge: 2018-07-28 | Disposition: A | Discharge status: Home / Self Care | Payer: PPO | Source: Ambulatory Visit | Attending: Cardiothoracic Surgery | Admitting: Cardiothoracic Surgery

## 2018-07-28 ENCOUNTER — Ambulatory Visit: Payer: PPO | Admitting: Cardiothoracic Surgery

## 2018-07-28 ENCOUNTER — Encounter: Payer: Self-pay | Admitting: Cardiothoracic Surgery

## 2018-07-28 ENCOUNTER — Other Ambulatory Visit: Payer: Self-pay

## 2018-07-28 VITALS — BP 140/80 | HR 81 | Resp 18 | Ht 70.0 in | Wt 205.0 lb

## 2018-07-28 DIAGNOSIS — R918 Other nonspecific abnormal finding of lung field: Secondary | ICD-10-CM

## 2018-07-28 DIAGNOSIS — M21959 Unspecified acquired deformity of unspecified thigh: Secondary | ICD-10-CM | POA: Insufficient documentation

## 2018-07-28 DIAGNOSIS — I712 Thoracic aortic aneurysm, without rupture, unspecified: Secondary | ICD-10-CM

## 2018-07-28 DIAGNOSIS — I499 Cardiac arrhythmia, unspecified: Secondary | ICD-10-CM

## 2018-07-28 HISTORY — DX: Cardiac arrhythmia, unspecified: I49.9

## 2018-07-28 HISTORY — DX: Unspecified acquired deformity of unspecified thigh: M21.959

## 2018-07-28 MED ORDER — IOPAMIDOL (ISOVUE-370) INJECTION 76%
75.0000 mL | Freq: Once | INTRAVENOUS | Status: AC | PRN
  Administered 2018-07-28: 75 mL via INTRAVENOUS

## 2018-07-28 NOTE — Progress Notes (Signed)
PCP is Nodal, Alphonzo Dublin, PA-C Referring Provider is Gardiner Rhyme, MD  Chief Complaint  Patient presents with  . Thoracic Aortic Aneurysm    1 year f/u with Chest CT    HPI: One year follow-up for a fusiform ascending aneurysm, asymptomatic measuring at 4.6 cm for the past 2 years.  Patient has hypertension and takes Toprol-XL.  He takes aspirin 81 mg.  Today's images personally reviewed and counseled with patient. The ascending aorta remains mild to moderately enlarged and stable at 4.6 cm.  The aortic wall is smooth without ulceration or mural thrombus.  No abnormalities of the aortic arch or descending thoracic aorta.  A previously noted 6 mm left lung nodule remains stable unchanged.  Past Medical History:  Diagnosis Date  . Allergic rhinitis   . Aortic valve disorder    "it's enlarged; works fine" (03/30/2018)  . Arthritis    "knuckes, right knee" 9(03/30/2018)  . BPH (benign prostatic hypertrophy)   . Chest pain   . COPD (chronic obstructive pulmonary disease) ([place])   . Dyspnea   . GERD (gastroesophageal reflux disease)   . Headache    "used to have them alot; went away mostly after I quit smoking" (03/30/2018)  . Heart murmur   . History of blood transfusion 1949   "related to farm tractor accident"  . History of kidney stones    MORE THAN 10 YRS AGO  . Hypertension   . Hypothyroidism   . Hypothyroidism   . Pneumonia    "once" (03/30/2018)  . PONV (postoperative nausea and vomiting)   . Sleep apnea    DID NOT GO BACK FOR 2ND TEST.  NO MACHINE    Past Surgical History:  Procedure Laterality Date  . ANKLE SURGERY Left 1949   "DEFORMED"  D/T TRACTOR ACCIDENT  . BACK SURGERY    . FIXATION KYPHOPLASTY LUMBAR SPINE  2013  . FRACTURE SURGERY    . JOINT REPLACEMENT    . TONSILLECTOMY    . TOTAL KNEE ARTHROPLASTY Right 03/29/2018   Procedure: RIGHT TOTAL KNEE ARTHROPLASTY;  Surgeon: Vickey Huger, MD;  Location: [facility];  Service: Orthopedics;  Laterality: Right;     Family History  Problem Relation Age of Onset  . Hyperlipidemia Mother   . Hypertension Mother   . Stroke Mother   . Leukemia Mother   . Cancer Father   . Heart disease Father   . Early death Father   . Cancer Sister   . Kidney disease Brother   . Heart disease Maternal Grandmother   . Kidney disease Maternal Grandmother   . Heart disease Maternal Grandfather     Social History Social History   Tobacco Use  . Smoking status: Former Smoker    Packs/day: 1.00    Years: 25.00    Pack years: 25.00    Types: Cigarettes    Last attempt to quit: 12/16/1987    Years since quitting: 30.6  . Smokeless tobacco: Never Used  Substance Use Topics  . Alcohol use: Yes    Alcohol/week: 4.0 standard drinks    Types: 4 Cans of beer per week  . Drug use: No    Current Outpatient Medications  Medication Sig Dispense Refill  . acetaminophen (TYLENOL) 500 MG tablet Take 500-1,000 mg by mouth every 6 (six) hours as needed (for pain.).    Marland Kitchen aspirin EC 81 MG tablet Take 81 mg by mouth daily.    . cetirizine (ZYRTEC) 10 MG tablet Take 10 mg  by mouth daily after breakfast.    . Cyanocobalamin (VITAMIN B-12) 3000 MCG SUBL Place 3,000 mcg under the tongue 2 (two) times daily after a meal.    . dutasteride (AVODART) 0.5 MG capsule Take 0.5 mg by mouth daily after breakfast.     . gabapentin (NEURONTIN) 100 MG capsule Take 100-200 mg by mouth at bedtime as needed (for pain.).    Marland Kitchen levothyroxine (SYNTHROID, LEVOTHROID) 125 MCG tablet Take 125 mcg by mouth daily before breakfast.  0  . meloxicam (MOBIC) 15 MG tablet Take 15 mg by mouth daily.    . metoprolol succinate (TOPROL-XL) 25 MG 24 hr tablet Take 12.5 mg by mouth daily after breakfast.  3  . omeprazole (PRILOSEC) 40 MG capsule Take 40 mg by mouth daily before breakfast.  2  . rOPINIRole (REQUIP) 4 MG tablet Take 4 mg by mouth at bedtime.  3  . senna (SENOKOT) 8.6 MG TABS tablet Take 1 tablet (8.6 mg total) by mouth daily. 120 each 0  .  sildenafil (REVATIO) 20 MG tablet Take 20 mg by mouth 3 (three) times daily.    Marland Kitchen tolterodine (DETROL LA) 4 MG 24 hr capsule Take 4 mg by mouth daily after breakfast.  11   No current facility-administered medications for this visit.     No Known Allergies  Review of Systems   Patient underwent right total knee replacement earlier this year and recovered well.  No cardiovascular problems. Currently is having problems walking because of a badly deformed left ankle and is being evaluated by a foot and ankle orthopedic specialist at [facility].  BP 140/80   Pulse 81   Resp 18   Ht 5\' 10"  (1.778 m)   Wt 205 lb (93 kg)   SpO2 95% Comment: RA  BMI 29.41 kg/m  Physical Exam      Physical Exam  General:  HEENT: Normocephalic pupils equal , dentition adequate Neck: Supple without JVD, adenopathy, or bruit Chest: Clear to auscultation, symmetrical breath sounds, no rhonchi, no tenderness             or deformity Cardiovascular: Regular rate and rhythm, no murmur, no gallop, peripheral pulses             palpable in all extremities Abdomen:  Soft, nontender, no palpable mass or organomegaly Extremities: Warm, well-perfused, no clubbing cyanosis edema or tenderness,              no venous stasis changes of the legs.  Left ankle badly deformed.  Well-healed surgical scar over right knee. Rectal/GU: Deferred Neuro: Grossly non--focal and symmetrical throughout Skin: Clean and dry without rash or ulceration  Diagnostic Tests: CTA shows 4.6 cm fusiform dilatation of the ascending aorta.  Impression: Patient is at low risk for aortic dissection, less than 1 to 2 %/year.  Best treatment is blood pressure control and continuing his dose of metoprolol.  He understands his blood pressure should be kept less than 678 systolic and his metoprolol dose should be increased if that threshold is exceeded.  Plan: Return with CTA of thoracic aorta in 1 year.   Len Childs, MD [facility] [phone]

## 2018-08-04 DIAGNOSIS — H2513 Age-related nuclear cataract, bilateral: Secondary | ICD-10-CM | POA: Diagnosis not present

## 2018-08-06 DIAGNOSIS — M4726 Other spondylosis with radiculopathy, lumbar region: Secondary | ICD-10-CM | POA: Diagnosis not present

## 2018-08-06 DIAGNOSIS — M5416 Radiculopathy, lumbar region: Secondary | ICD-10-CM | POA: Diagnosis not present

## 2018-08-06 DIAGNOSIS — M2170 Unequal limb length (acquired), unspecified site: Secondary | ICD-10-CM | POA: Diagnosis not present

## 2018-08-06 DIAGNOSIS — M25572 Pain in left ankle and joints of left foot: Secondary | ICD-10-CM | POA: Diagnosis not present

## 2018-08-06 DIAGNOSIS — M47816 Spondylosis without myelopathy or radiculopathy, lumbar region: Secondary | ICD-10-CM | POA: Diagnosis not present

## 2018-08-06 DIAGNOSIS — M21072 Valgus deformity, not elsewhere classified, left ankle: Secondary | ICD-10-CM | POA: Diagnosis not present

## 2018-08-06 DIAGNOSIS — G8929 Other chronic pain: Secondary | ICD-10-CM | POA: Diagnosis not present

## 2018-08-06 DIAGNOSIS — M25561 Pain in right knee: Secondary | ICD-10-CM | POA: Diagnosis not present

## 2018-08-10 DIAGNOSIS — G2581 Restless legs syndrome: Secondary | ICD-10-CM | POA: Diagnosis not present

## 2018-08-10 DIAGNOSIS — M545 Low back pain: Secondary | ICD-10-CM | POA: Diagnosis not present

## 2018-08-11 IMAGING — DX DG CHEST 1V PORT
1 series · 1 of 1 positions shown · non-contrast
Comparison: 06/15/2015.

CLINICAL DATA: Acute respiratory distress.

EXAM:
PORTABLE CHEST 1 VIEW

[chest ap]
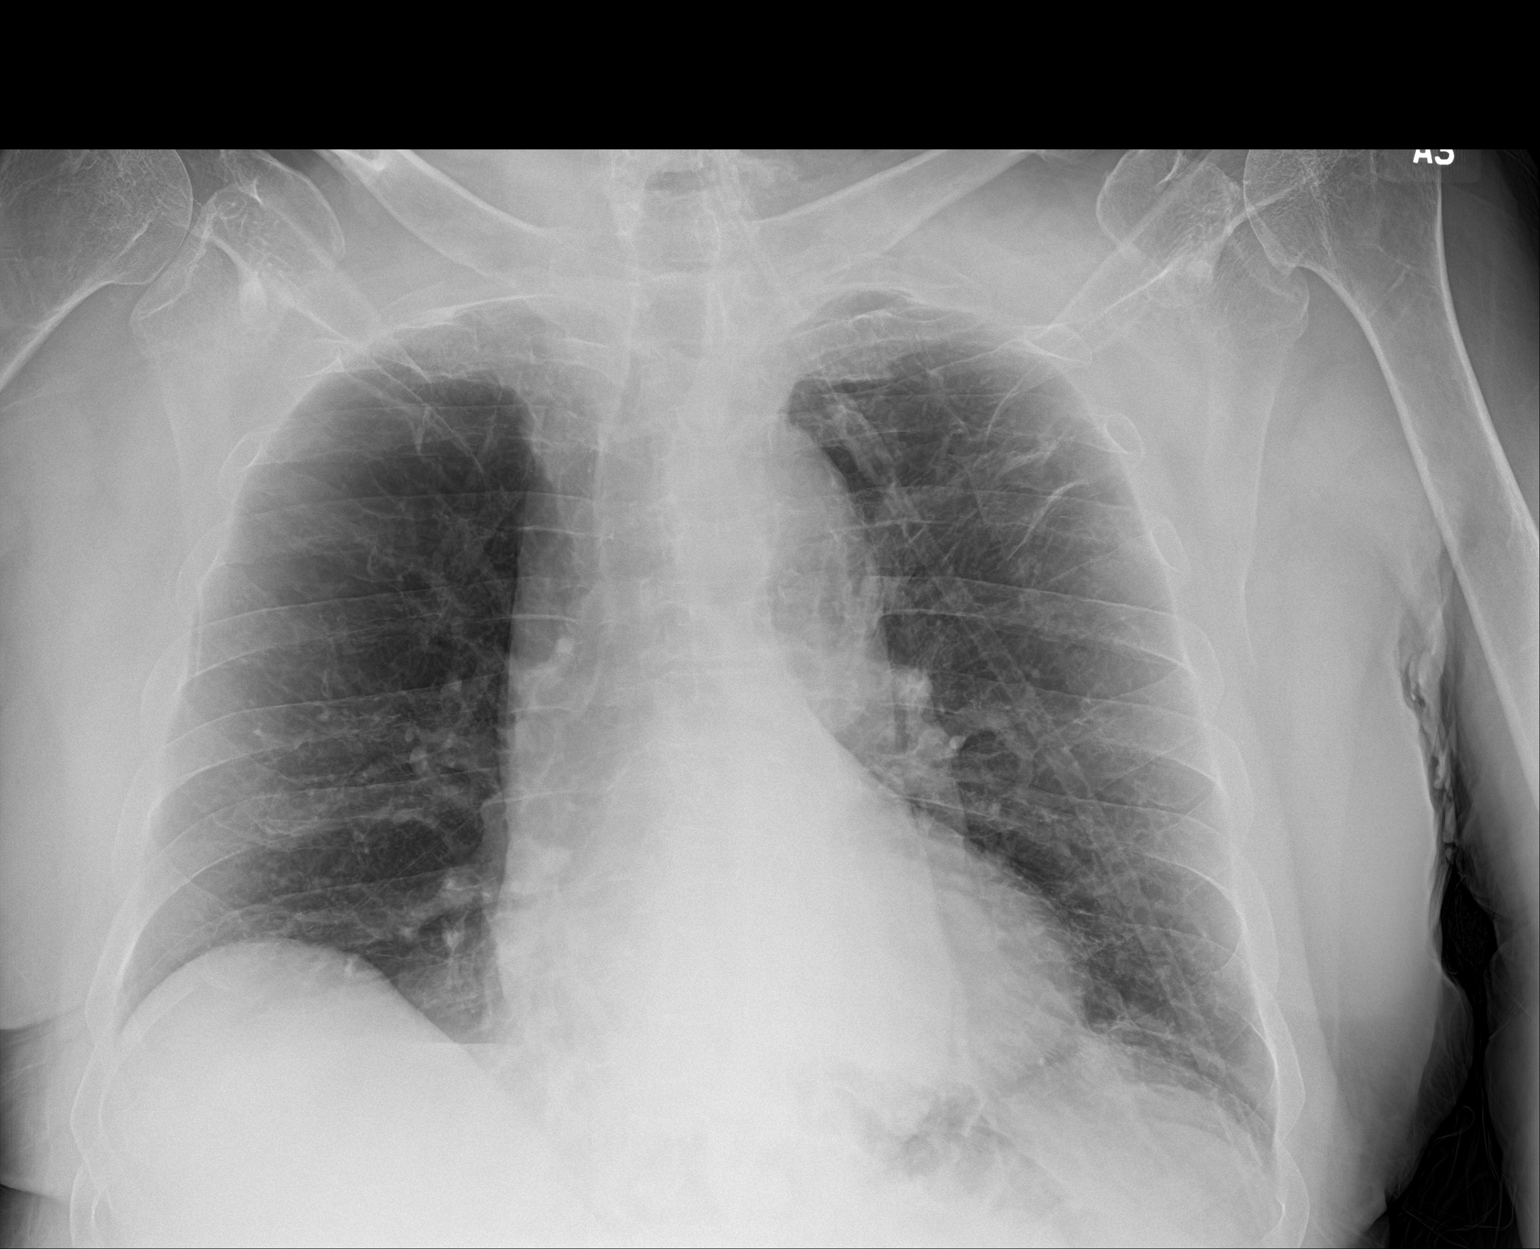

[1 of 1 positions shown; findings below may reference images not displayed]

FINDINGS: Normal heart size. Calcified tortuous aorta. No consolidation or
edema. Mild BILATERAL upper lobe scarring. No acute osseous
findings.
IMPRESSION: No consolidation or edema. Upper lobe scarring is chronic. Improved
aeration compared with 06/15/2015.

## 2018-08-12 DIAGNOSIS — M5136 Other intervertebral disc degeneration, lumbar region: Secondary | ICD-10-CM | POA: Diagnosis not present

## 2018-08-12 DIAGNOSIS — S32040A Wedge compression fracture of fourth lumbar vertebra, initial encounter for closed fracture: Secondary | ICD-10-CM | POA: Diagnosis not present

## 2018-08-12 DIAGNOSIS — S32020A Wedge compression fracture of second lumbar vertebra, initial encounter for closed fracture: Secondary | ICD-10-CM | POA: Diagnosis not present

## 2018-08-12 DIAGNOSIS — M545 Low back pain: Secondary | ICD-10-CM | POA: Diagnosis not present

## 2018-08-12 DIAGNOSIS — M48061 Spinal stenosis, lumbar region without neurogenic claudication: Secondary | ICD-10-CM | POA: Diagnosis not present

## 2018-08-18 DIAGNOSIS — S32048D Other fracture of fourth lumbar vertebra, subsequent encounter for fracture with routine healing: Secondary | ICD-10-CM | POA: Diagnosis not present

## 2018-08-18 DIAGNOSIS — M48062 Spinal stenosis, lumbar region with neurogenic claudication: Secondary | ICD-10-CM | POA: Diagnosis not present

## 2018-08-18 DIAGNOSIS — S32028D Other fracture of second lumbar vertebra, subsequent encounter for fracture with routine healing: Secondary | ICD-10-CM | POA: Diagnosis not present

## 2018-08-18 DIAGNOSIS — M4856XA Collapsed vertebra, not elsewhere classified, lumbar region, initial encounter for fracture: Secondary | ICD-10-CM | POA: Diagnosis not present

## 2018-08-18 DIAGNOSIS — S32010A Wedge compression fracture of first lumbar vertebra, initial encounter for closed fracture: Secondary | ICD-10-CM | POA: Diagnosis not present

## 2018-08-18 DIAGNOSIS — M545 Low back pain: Secondary | ICD-10-CM | POA: Diagnosis not present

## 2018-08-20 DIAGNOSIS — M2170 Unequal limb length (acquired), unspecified site: Secondary | ICD-10-CM | POA: Diagnosis not present

## 2018-08-20 DIAGNOSIS — M48061 Spinal stenosis, lumbar region without neurogenic claudication: Secondary | ICD-10-CM | POA: Diagnosis not present

## 2018-08-20 DIAGNOSIS — M25572 Pain in left ankle and joints of left foot: Secondary | ICD-10-CM | POA: Diagnosis not present

## 2018-08-20 DIAGNOSIS — Z79891 Long term (current) use of opiate analgesic: Secondary | ICD-10-CM | POA: Diagnosis not present

## 2018-08-20 DIAGNOSIS — Z791 Long term (current) use of non-steroidal anti-inflammatories (NSAID): Secondary | ICD-10-CM | POA: Diagnosis not present

## 2018-08-20 DIAGNOSIS — Z79899 Other long term (current) drug therapy: Secondary | ICD-10-CM | POA: Diagnosis not present

## 2018-08-20 DIAGNOSIS — Z96651 Presence of right artificial knee joint: Secondary | ICD-10-CM | POA: Diagnosis not present

## 2018-08-20 DIAGNOSIS — S32049A Unspecified fracture of fourth lumbar vertebra, initial encounter for closed fracture: Secondary | ICD-10-CM | POA: Diagnosis not present

## 2018-08-20 DIAGNOSIS — M549 Dorsalgia, unspecified: Secondary | ICD-10-CM | POA: Diagnosis not present

## 2018-08-20 DIAGNOSIS — M21072 Valgus deformity, not elsewhere classified, left ankle: Secondary | ICD-10-CM | POA: Diagnosis not present

## 2018-08-20 DIAGNOSIS — S32029A Unspecified fracture of second lumbar vertebra, initial encounter for closed fracture: Secondary | ICD-10-CM | POA: Diagnosis not present

## 2018-08-20 DIAGNOSIS — G8929 Other chronic pain: Secondary | ICD-10-CM | POA: Diagnosis not present

## 2018-09-01 DIAGNOSIS — Z87828 Personal history of other (healed) physical injury and trauma: Secondary | ICD-10-CM | POA: Diagnosis not present

## 2018-09-01 DIAGNOSIS — M545 Low back pain: Secondary | ICD-10-CM | POA: Diagnosis not present

## 2018-09-01 DIAGNOSIS — M810 Age-related osteoporosis without current pathological fracture: Secondary | ICD-10-CM | POA: Diagnosis not present

## 2018-09-01 DIAGNOSIS — M8588 Other specified disorders of bone density and structure, other site: Secondary | ICD-10-CM | POA: Diagnosis not present

## 2018-09-02 DIAGNOSIS — M21072 Valgus deformity, not elsewhere classified, left ankle: Secondary | ICD-10-CM | POA: Diagnosis not present

## 2018-09-02 DIAGNOSIS — M21372 Foot drop, left foot: Secondary | ICD-10-CM | POA: Diagnosis not present

## 2018-09-09 DIAGNOSIS — Z23 Encounter for immunization: Secondary | ICD-10-CM | POA: Diagnosis not present

## 2018-09-15 DIAGNOSIS — D1801 Hemangioma of skin and subcutaneous tissue: Secondary | ICD-10-CM | POA: Diagnosis not present

## 2018-09-15 DIAGNOSIS — D2239 Melanocytic nevi of other parts of face: Secondary | ICD-10-CM | POA: Diagnosis not present

## 2018-09-15 DIAGNOSIS — D225 Melanocytic nevi of trunk: Secondary | ICD-10-CM | POA: Diagnosis not present

## 2018-09-15 DIAGNOSIS — L821 Other seborrheic keratosis: Secondary | ICD-10-CM | POA: Diagnosis not present

## 2018-09-30 DIAGNOSIS — R94131 Abnormal electromyogram [EMG]: Secondary | ICD-10-CM | POA: Diagnosis not present

## 2018-09-30 DIAGNOSIS — M5418 Radiculopathy, sacral and sacrococcygeal region: Secondary | ICD-10-CM | POA: Diagnosis not present

## 2018-09-30 DIAGNOSIS — G729 Myopathy, unspecified: Secondary | ICD-10-CM | POA: Diagnosis not present

## 2018-10-01 DIAGNOSIS — G729 Myopathy, unspecified: Secondary | ICD-10-CM | POA: Diagnosis not present

## 2018-10-20 DIAGNOSIS — S32040D Wedge compression fracture of fourth lumbar vertebra, subsequent encounter for fracture with routine healing: Secondary | ICD-10-CM | POA: Diagnosis not present

## 2018-10-20 DIAGNOSIS — S32020D Wedge compression fracture of second lumbar vertebra, subsequent encounter for fracture with routine healing: Secondary | ICD-10-CM | POA: Diagnosis not present

## 2018-10-20 DIAGNOSIS — S32048D Other fracture of fourth lumbar vertebra, subsequent encounter for fracture with routine healing: Secondary | ICD-10-CM | POA: Diagnosis not present

## 2018-10-20 DIAGNOSIS — M818 Other osteoporosis without current pathological fracture: Secondary | ICD-10-CM | POA: Diagnosis not present

## 2018-10-20 DIAGNOSIS — M4856XD Collapsed vertebra, not elsewhere classified, lumbar region, subsequent encounter for fracture with routine healing: Secondary | ICD-10-CM | POA: Diagnosis not present

## 2018-10-20 DIAGNOSIS — S32028D Other fracture of second lumbar vertebra, subsequent encounter for fracture with routine healing: Secondary | ICD-10-CM | POA: Diagnosis not present

## 2018-10-21 DIAGNOSIS — Z8781 Personal history of (healed) traumatic fracture: Secondary | ICD-10-CM | POA: Diagnosis not present

## 2018-10-21 DIAGNOSIS — Z5181 Encounter for therapeutic drug level monitoring: Secondary | ICD-10-CM | POA: Diagnosis not present

## 2018-10-21 DIAGNOSIS — M810 Age-related osteoporosis without current pathological fracture: Secondary | ICD-10-CM | POA: Diagnosis not present

## 2018-10-27 DIAGNOSIS — S32020D Wedge compression fracture of second lumbar vertebra, subsequent encounter for fracture with routine healing: Secondary | ICD-10-CM | POA: Diagnosis not present

## 2018-10-27 DIAGNOSIS — K625 Hemorrhage of anus and rectum: Secondary | ICD-10-CM | POA: Diagnosis not present

## 2018-10-27 DIAGNOSIS — S32040D Wedge compression fracture of fourth lumbar vertebra, subsequent encounter for fracture with routine healing: Secondary | ICD-10-CM | POA: Diagnosis not present

## 2018-11-03 DIAGNOSIS — K625 Hemorrhage of anus and rectum: Secondary | ICD-10-CM | POA: Diagnosis not present

## 2018-11-08 DIAGNOSIS — R350 Frequency of micturition: Secondary | ICD-10-CM | POA: Diagnosis not present

## 2018-11-08 DIAGNOSIS — R3914 Feeling of incomplete bladder emptying: Secondary | ICD-10-CM | POA: Diagnosis not present

## 2018-11-08 DIAGNOSIS — N401 Enlarged prostate with lower urinary tract symptoms: Secondary | ICD-10-CM | POA: Diagnosis not present

## 2018-11-16 DIAGNOSIS — E039 Hypothyroidism, unspecified: Secondary | ICD-10-CM | POA: Diagnosis not present

## 2018-11-16 DIAGNOSIS — M1990 Unspecified osteoarthritis, unspecified site: Secondary | ICD-10-CM | POA: Diagnosis not present

## 2018-11-16 DIAGNOSIS — K644 Residual hemorrhoidal skin tags: Secondary | ICD-10-CM | POA: Diagnosis not present

## 2018-11-16 DIAGNOSIS — Z87891 Personal history of nicotine dependence: Secondary | ICD-10-CM | POA: Diagnosis not present

## 2018-11-16 DIAGNOSIS — Z7982 Long term (current) use of aspirin: Secondary | ICD-10-CM | POA: Diagnosis not present

## 2018-11-16 DIAGNOSIS — K648 Other hemorrhoids: Secondary | ICD-10-CM | POA: Diagnosis not present

## 2018-11-16 DIAGNOSIS — K625 Hemorrhage of anus and rectum: Secondary | ICD-10-CM | POA: Diagnosis not present

## 2018-11-16 DIAGNOSIS — Z79899 Other long term (current) drug therapy: Secondary | ICD-10-CM | POA: Diagnosis not present

## 2018-11-17 DIAGNOSIS — M48062 Spinal stenosis, lumbar region with neurogenic claudication: Secondary | ICD-10-CM | POA: Diagnosis not present

## 2018-11-17 DIAGNOSIS — M11252 Other chondrocalcinosis, left hip: Secondary | ICD-10-CM | POA: Diagnosis not present

## 2018-11-17 DIAGNOSIS — M25551 Pain in right hip: Secondary | ICD-10-CM | POA: Diagnosis not present

## 2018-11-17 DIAGNOSIS — M160 Bilateral primary osteoarthritis of hip: Secondary | ICD-10-CM | POA: Diagnosis not present

## 2018-11-17 DIAGNOSIS — M11251 Other chondrocalcinosis, right hip: Secondary | ICD-10-CM | POA: Diagnosis not present

## 2018-11-17 DIAGNOSIS — M25552 Pain in left hip: Secondary | ICD-10-CM | POA: Diagnosis not present

## 2019-04-05 DIAGNOSIS — M8008XD Age-related osteoporosis with current pathological fracture, vertebra(e), subsequent encounter for fracture with routine healing: Secondary | ICD-10-CM | POA: Diagnosis not present

## 2019-04-05 DIAGNOSIS — S32040D Wedge compression fracture of fourth lumbar vertebra, subsequent encounter for fracture with routine healing: Secondary | ICD-10-CM | POA: Diagnosis not present

## 2019-04-28 DIAGNOSIS — M47896 Other spondylosis, lumbar region: Secondary | ICD-10-CM | POA: Diagnosis not present

## 2019-04-28 DIAGNOSIS — S32030S Wedge compression fracture of third lumbar vertebra, sequela: Secondary | ICD-10-CM | POA: Diagnosis not present

## 2019-04-28 DIAGNOSIS — S32040S Wedge compression fracture of fourth lumbar vertebra, sequela: Secondary | ICD-10-CM | POA: Diagnosis not present

## 2019-04-28 DIAGNOSIS — Z87310 Personal history of (healed) osteoporosis fracture: Secondary | ICD-10-CM | POA: Diagnosis not present

## 2019-04-28 DIAGNOSIS — S32028S Other fracture of second lumbar vertebra, sequela: Secondary | ICD-10-CM | POA: Diagnosis not present

## 2019-04-28 DIAGNOSIS — M5441 Lumbago with sciatica, right side: Secondary | ICD-10-CM | POA: Diagnosis not present

## 2019-04-28 DIAGNOSIS — M545 Low back pain: Secondary | ICD-10-CM | POA: Diagnosis not present

## 2019-05-03 DIAGNOSIS — S32030D Wedge compression fracture of third lumbar vertebra, subsequent encounter for fracture with routine healing: Secondary | ICD-10-CM | POA: Diagnosis not present

## 2019-05-03 DIAGNOSIS — S32040D Wedge compression fracture of fourth lumbar vertebra, subsequent encounter for fracture with routine healing: Secondary | ICD-10-CM | POA: Diagnosis not present

## 2019-05-03 DIAGNOSIS — S32028D Other fracture of second lumbar vertebra, subsequent encounter for fracture with routine healing: Secondary | ICD-10-CM | POA: Diagnosis not present

## 2019-05-03 DIAGNOSIS — Z87310 Personal history of (healed) osteoporosis fracture: Secondary | ICD-10-CM | POA: Diagnosis not present

## 2019-05-03 DIAGNOSIS — M5441 Lumbago with sciatica, right side: Secondary | ICD-10-CM | POA: Diagnosis not present

## 2019-05-03 DIAGNOSIS — M545 Low back pain: Secondary | ICD-10-CM | POA: Diagnosis not present

## 2019-05-03 DIAGNOSIS — M47896 Other spondylosis, lumbar region: Secondary | ICD-10-CM | POA: Diagnosis not present

## 2019-05-05 DIAGNOSIS — Z87310 Personal history of (healed) osteoporosis fracture: Secondary | ICD-10-CM | POA: Diagnosis not present

## 2019-05-05 DIAGNOSIS — S32028D Other fracture of second lumbar vertebra, subsequent encounter for fracture with routine healing: Secondary | ICD-10-CM | POA: Diagnosis not present

## 2019-05-05 DIAGNOSIS — M47896 Other spondylosis, lumbar region: Secondary | ICD-10-CM | POA: Diagnosis not present

## 2019-05-05 DIAGNOSIS — S32030D Wedge compression fracture of third lumbar vertebra, subsequent encounter for fracture with routine healing: Secondary | ICD-10-CM | POA: Diagnosis not present

## 2019-05-05 DIAGNOSIS — M5441 Lumbago with sciatica, right side: Secondary | ICD-10-CM | POA: Diagnosis not present

## 2019-05-05 DIAGNOSIS — M545 Low back pain: Secondary | ICD-10-CM | POA: Diagnosis not present

## 2019-05-05 DIAGNOSIS — S32040D Wedge compression fracture of fourth lumbar vertebra, subsequent encounter for fracture with routine healing: Secondary | ICD-10-CM | POA: Diagnosis not present

## 2019-05-12 DIAGNOSIS — S32030D Wedge compression fracture of third lumbar vertebra, subsequent encounter for fracture with routine healing: Secondary | ICD-10-CM | POA: Diagnosis not present

## 2019-05-12 DIAGNOSIS — S32040D Wedge compression fracture of fourth lumbar vertebra, subsequent encounter for fracture with routine healing: Secondary | ICD-10-CM | POA: Diagnosis not present

## 2019-05-12 DIAGNOSIS — S32028D Other fracture of second lumbar vertebra, subsequent encounter for fracture with routine healing: Secondary | ICD-10-CM | POA: Diagnosis not present

## 2019-05-12 DIAGNOSIS — M545 Low back pain: Secondary | ICD-10-CM | POA: Diagnosis not present

## 2019-05-12 DIAGNOSIS — Z87310 Personal history of (healed) osteoporosis fracture: Secondary | ICD-10-CM | POA: Diagnosis not present

## 2019-05-12 DIAGNOSIS — M47896 Other spondylosis, lumbar region: Secondary | ICD-10-CM | POA: Diagnosis not present

## 2019-05-12 DIAGNOSIS — M5441 Lumbago with sciatica, right side: Secondary | ICD-10-CM | POA: Diagnosis not present

## 2019-05-16 DIAGNOSIS — M25551 Pain in right hip: Secondary | ICD-10-CM | POA: Diagnosis not present

## 2019-05-16 DIAGNOSIS — M25561 Pain in right knee: Secondary | ICD-10-CM | POA: Diagnosis not present

## 2019-05-16 DIAGNOSIS — M8008XD Age-related osteoporosis with current pathological fracture, vertebra(e), subsequent encounter for fracture with routine healing: Secondary | ICD-10-CM | POA: Diagnosis not present

## 2019-05-16 DIAGNOSIS — S32040D Wedge compression fracture of fourth lumbar vertebra, subsequent encounter for fracture with routine healing: Secondary | ICD-10-CM | POA: Diagnosis not present

## 2019-05-17 DIAGNOSIS — S32040D Wedge compression fracture of fourth lumbar vertebra, subsequent encounter for fracture with routine healing: Secondary | ICD-10-CM | POA: Diagnosis not present

## 2019-05-17 DIAGNOSIS — M5441 Lumbago with sciatica, right side: Secondary | ICD-10-CM | POA: Diagnosis not present

## 2019-05-17 DIAGNOSIS — Z87310 Personal history of (healed) osteoporosis fracture: Secondary | ICD-10-CM | POA: Diagnosis not present

## 2019-05-17 DIAGNOSIS — M545 Low back pain: Secondary | ICD-10-CM | POA: Diagnosis not present

## 2019-05-17 DIAGNOSIS — S32028D Other fracture of second lumbar vertebra, subsequent encounter for fracture with routine healing: Secondary | ICD-10-CM | POA: Diagnosis not present

## 2019-05-17 DIAGNOSIS — M47896 Other spondylosis, lumbar region: Secondary | ICD-10-CM | POA: Diagnosis not present

## 2019-05-17 DIAGNOSIS — S32030D Wedge compression fracture of third lumbar vertebra, subsequent encounter for fracture with routine healing: Secondary | ICD-10-CM | POA: Diagnosis not present

## 2019-05-19 DIAGNOSIS — S32030D Wedge compression fracture of third lumbar vertebra, subsequent encounter for fracture with routine healing: Secondary | ICD-10-CM | POA: Diagnosis not present

## 2019-05-19 DIAGNOSIS — M47896 Other spondylosis, lumbar region: Secondary | ICD-10-CM | POA: Diagnosis not present

## 2019-05-19 DIAGNOSIS — S32040D Wedge compression fracture of fourth lumbar vertebra, subsequent encounter for fracture with routine healing: Secondary | ICD-10-CM | POA: Diagnosis not present

## 2019-05-19 DIAGNOSIS — M545 Low back pain: Secondary | ICD-10-CM | POA: Diagnosis not present

## 2019-05-19 DIAGNOSIS — M5441 Lumbago with sciatica, right side: Secondary | ICD-10-CM | POA: Diagnosis not present

## 2019-05-19 DIAGNOSIS — S32028D Other fracture of second lumbar vertebra, subsequent encounter for fracture with routine healing: Secondary | ICD-10-CM | POA: Diagnosis not present

## 2019-05-19 DIAGNOSIS — Z87310 Personal history of (healed) osteoporosis fracture: Secondary | ICD-10-CM | POA: Diagnosis not present

## 2019-05-31 DIAGNOSIS — M545 Low back pain: Secondary | ICD-10-CM | POA: Diagnosis not present

## 2019-05-31 DIAGNOSIS — M5441 Lumbago with sciatica, right side: Secondary | ICD-10-CM | POA: Diagnosis not present

## 2019-05-31 DIAGNOSIS — S32030D Wedge compression fracture of third lumbar vertebra, subsequent encounter for fracture with routine healing: Secondary | ICD-10-CM | POA: Diagnosis not present

## 2019-05-31 DIAGNOSIS — S32040D Wedge compression fracture of fourth lumbar vertebra, subsequent encounter for fracture with routine healing: Secondary | ICD-10-CM | POA: Diagnosis not present

## 2019-05-31 DIAGNOSIS — M47896 Other spondylosis, lumbar region: Secondary | ICD-10-CM | POA: Diagnosis not present

## 2019-05-31 DIAGNOSIS — S32028D Other fracture of second lumbar vertebra, subsequent encounter for fracture with routine healing: Secondary | ICD-10-CM | POA: Diagnosis not present

## 2019-05-31 DIAGNOSIS — Z87310 Personal history of (healed) osteoporosis fracture: Secondary | ICD-10-CM | POA: Diagnosis not present

## 2019-06-02 DIAGNOSIS — S32030D Wedge compression fracture of third lumbar vertebra, subsequent encounter for fracture with routine healing: Secondary | ICD-10-CM | POA: Diagnosis not present

## 2019-06-02 DIAGNOSIS — M545 Low back pain: Secondary | ICD-10-CM | POA: Diagnosis not present

## 2019-06-02 DIAGNOSIS — S32028S Other fracture of second lumbar vertebra, sequela: Secondary | ICD-10-CM | POA: Diagnosis not present

## 2019-06-02 DIAGNOSIS — Z87310 Personal history of (healed) osteoporosis fracture: Secondary | ICD-10-CM | POA: Diagnosis not present

## 2019-06-02 DIAGNOSIS — M5441 Lumbago with sciatica, right side: Secondary | ICD-10-CM | POA: Diagnosis not present

## 2019-06-02 DIAGNOSIS — S32040D Wedge compression fracture of fourth lumbar vertebra, subsequent encounter for fracture with routine healing: Secondary | ICD-10-CM | POA: Diagnosis not present

## 2019-06-02 DIAGNOSIS — M47896 Other spondylosis, lumbar region: Secondary | ICD-10-CM | POA: Diagnosis not present

## 2019-06-13 DIAGNOSIS — M25561 Pain in right knee: Secondary | ICD-10-CM | POA: Diagnosis not present

## 2019-06-13 DIAGNOSIS — M25552 Pain in left hip: Secondary | ICD-10-CM | POA: Diagnosis not present

## 2019-06-13 DIAGNOSIS — M810 Age-related osteoporosis without current pathological fracture: Secondary | ICD-10-CM | POA: Diagnosis not present

## 2019-07-03 ENCOUNTER — Other Ambulatory Visit: Payer: Self-pay | Admitting: Cardiothoracic Surgery

## 2019-07-03 DIAGNOSIS — I712 Thoracic aortic aneurysm, without rupture, unspecified: Secondary | ICD-10-CM

## 2019-07-04 DIAGNOSIS — H524 Presbyopia: Secondary | ICD-10-CM | POA: Diagnosis not present

## 2019-07-04 DIAGNOSIS — H40013 Open angle with borderline findings, low risk, bilateral: Secondary | ICD-10-CM | POA: Diagnosis not present

## 2019-07-04 DIAGNOSIS — H2513 Age-related nuclear cataract, bilateral: Secondary | ICD-10-CM | POA: Diagnosis not present

## 2019-07-05 DIAGNOSIS — M79661 Pain in right lower leg: Secondary | ICD-10-CM | POA: Diagnosis not present

## 2019-07-05 DIAGNOSIS — M25571 Pain in right ankle and joints of right foot: Secondary | ICD-10-CM | POA: Diagnosis not present

## 2019-07-06 DIAGNOSIS — M810 Age-related osteoporosis without current pathological fracture: Secondary | ICD-10-CM | POA: Diagnosis not present

## 2019-07-06 DIAGNOSIS — H40013 Open angle with borderline findings, low risk, bilateral: Secondary | ICD-10-CM | POA: Diagnosis not present

## 2019-07-12 DIAGNOSIS — H40013 Open angle with borderline findings, low risk, bilateral: Secondary | ICD-10-CM | POA: Diagnosis not present

## 2019-07-12 DIAGNOSIS — H2513 Age-related nuclear cataract, bilateral: Secondary | ICD-10-CM | POA: Diagnosis not present

## 2019-08-10 ENCOUNTER — Other Ambulatory Visit: Payer: PPO

## 2019-08-10 ENCOUNTER — Ambulatory Visit: Payer: PPO | Admitting: Cardiothoracic Surgery

## 2019-08-18 DIAGNOSIS — G2581 Restless legs syndrome: Secondary | ICD-10-CM | POA: Diagnosis not present

## 2019-08-18 DIAGNOSIS — F321 Major depressive disorder, single episode, moderate: Secondary | ICD-10-CM | POA: Diagnosis not present

## 2019-08-18 DIAGNOSIS — M5431 Sciatica, right side: Secondary | ICD-10-CM | POA: Diagnosis not present

## 2019-08-18 DIAGNOSIS — E039 Hypothyroidism, unspecified: Secondary | ICD-10-CM | POA: Diagnosis not present

## 2019-08-18 DIAGNOSIS — S32040D Wedge compression fracture of fourth lumbar vertebra, subsequent encounter for fracture with routine healing: Secondary | ICD-10-CM | POA: Diagnosis not present

## 2019-08-18 DIAGNOSIS — L0231 Cutaneous abscess of buttock: Secondary | ICD-10-CM | POA: Diagnosis not present

## 2019-08-18 DIAGNOSIS — M8008XD Age-related osteoporosis with current pathological fracture, vertebra(e), subsequent encounter for fracture with routine healing: Secondary | ICD-10-CM | POA: Diagnosis not present

## 2019-11-17 DIAGNOSIS — R05 Cough: Secondary | ICD-10-CM | POA: Diagnosis not present

## 2019-11-17 DIAGNOSIS — M5431 Sciatica, right side: Secondary | ICD-10-CM | POA: Diagnosis not present

## 2019-11-17 DIAGNOSIS — Z125 Encounter for screening for malignant neoplasm of prostate: Secondary | ICD-10-CM | POA: Diagnosis not present

## 2019-11-17 DIAGNOSIS — E039 Hypothyroidism, unspecified: Secondary | ICD-10-CM | POA: Diagnosis not present

## 2019-11-17 DIAGNOSIS — G2581 Restless legs syndrome: Secondary | ICD-10-CM | POA: Diagnosis not present

## 2019-11-17 DIAGNOSIS — Z0000 Encounter for general adult medical examination without abnormal findings: Secondary | ICD-10-CM | POA: Diagnosis not present

## 2019-11-17 DIAGNOSIS — F321 Major depressive disorder, single episode, moderate: Secondary | ICD-10-CM | POA: Diagnosis not present

## 2019-12-19 DIAGNOSIS — M79671 Pain in right foot: Secondary | ICD-10-CM | POA: Diagnosis not present

## 2019-12-19 DIAGNOSIS — R05 Cough: Secondary | ICD-10-CM | POA: Diagnosis not present

## 2019-12-22 DIAGNOSIS — M25571 Pain in right ankle and joints of right foot: Secondary | ICD-10-CM | POA: Diagnosis not present

## 2019-12-22 DIAGNOSIS — J439 Emphysema, unspecified: Secondary | ICD-10-CM | POA: Diagnosis not present

## 2019-12-22 DIAGNOSIS — I10 Essential (primary) hypertension: Secondary | ICD-10-CM | POA: Diagnosis not present

## 2019-12-22 DIAGNOSIS — M79671 Pain in right foot: Secondary | ICD-10-CM | POA: Diagnosis not present

## 2019-12-23 DIAGNOSIS — G2581 Restless legs syndrome: Secondary | ICD-10-CM | POA: Diagnosis not present

## 2019-12-23 DIAGNOSIS — E039 Hypothyroidism, unspecified: Secondary | ICD-10-CM | POA: Diagnosis not present

## 2019-12-26 DIAGNOSIS — N401 Enlarged prostate with lower urinary tract symptoms: Secondary | ICD-10-CM | POA: Diagnosis not present

## 2019-12-26 DIAGNOSIS — M8008XD Age-related osteoporosis with current pathological fracture, vertebra(e), subsequent encounter for fracture with routine healing: Secondary | ICD-10-CM | POA: Diagnosis not present

## 2019-12-26 DIAGNOSIS — E039 Hypothyroidism, unspecified: Secondary | ICD-10-CM | POA: Diagnosis not present

## 2019-12-26 DIAGNOSIS — G2581 Restless legs syndrome: Secondary | ICD-10-CM | POA: Diagnosis not present

## 2019-12-26 DIAGNOSIS — I10 Essential (primary) hypertension: Secondary | ICD-10-CM | POA: Diagnosis not present

## 2020-01-12 DIAGNOSIS — H2513 Age-related nuclear cataract, bilateral: Secondary | ICD-10-CM | POA: Diagnosis not present

## 2020-01-12 DIAGNOSIS — H40013 Open angle with borderline findings, low risk, bilateral: Secondary | ICD-10-CM | POA: Diagnosis not present

## 2020-01-23 DIAGNOSIS — H2513 Age-related nuclear cataract, bilateral: Secondary | ICD-10-CM | POA: Diagnosis not present

## 2020-01-23 DIAGNOSIS — H40031 Anatomical narrow angle, right eye: Secondary | ICD-10-CM | POA: Diagnosis not present

## 2020-01-23 DIAGNOSIS — H40013 Open angle with borderline findings, low risk, bilateral: Secondary | ICD-10-CM | POA: Diagnosis not present

## 2020-01-23 DIAGNOSIS — H40032 Anatomical narrow angle, left eye: Secondary | ICD-10-CM | POA: Diagnosis not present

## 2020-01-30 DIAGNOSIS — H40031 Anatomical narrow angle, right eye: Secondary | ICD-10-CM | POA: Diagnosis not present

## 2020-02-03 DIAGNOSIS — H40032 Anatomical narrow angle, left eye: Secondary | ICD-10-CM | POA: Diagnosis not present

## 2020-02-07 DIAGNOSIS — E039 Hypothyroidism, unspecified: Secondary | ICD-10-CM | POA: Diagnosis not present

## 2020-02-07 DIAGNOSIS — M7662 Achilles tendinitis, left leg: Secondary | ICD-10-CM | POA: Diagnosis not present

## 2020-02-07 DIAGNOSIS — I10 Essential (primary) hypertension: Secondary | ICD-10-CM | POA: Diagnosis not present

## 2020-02-07 DIAGNOSIS — M542 Cervicalgia: Secondary | ICD-10-CM | POA: Diagnosis not present

## 2020-02-07 DIAGNOSIS — S92344S Nondisplaced fracture of fourth metatarsal bone, right foot, sequela: Secondary | ICD-10-CM | POA: Diagnosis not present

## 2020-02-10 DIAGNOSIS — H2513 Age-related nuclear cataract, bilateral: Secondary | ICD-10-CM | POA: Diagnosis not present

## 2020-02-10 DIAGNOSIS — H40013 Open angle with borderline findings, low risk, bilateral: Secondary | ICD-10-CM | POA: Diagnosis not present

## 2020-02-10 DIAGNOSIS — H40033 Anatomical narrow angle, bilateral: Secondary | ICD-10-CM | POA: Diagnosis not present

## 2020-02-28 DIAGNOSIS — Z01810 Encounter for preprocedural cardiovascular examination: Secondary | ICD-10-CM | POA: Diagnosis not present

## 2020-02-28 DIAGNOSIS — M79672 Pain in left foot: Secondary | ICD-10-CM | POA: Diagnosis not present

## 2020-02-28 DIAGNOSIS — R011 Cardiac murmur, unspecified: Secondary | ICD-10-CM | POA: Diagnosis not present

## 2020-02-28 DIAGNOSIS — M542 Cervicalgia: Secondary | ICD-10-CM | POA: Diagnosis not present

## 2020-02-28 DIAGNOSIS — H259 Unspecified age-related cataract: Secondary | ICD-10-CM | POA: Diagnosis not present

## 2020-02-29 DIAGNOSIS — R2681 Unsteadiness on feet: Secondary | ICD-10-CM | POA: Diagnosis not present

## 2020-02-29 DIAGNOSIS — S92331A Displaced fracture of third metatarsal bone, right foot, initial encounter for closed fracture: Secondary | ICD-10-CM | POA: Diagnosis not present

## 2020-02-29 DIAGNOSIS — S92344A Nondisplaced fracture of fourth metatarsal bone, right foot, initial encounter for closed fracture: Secondary | ICD-10-CM | POA: Diagnosis not present

## 2020-03-01 DIAGNOSIS — H2511 Age-related nuclear cataract, right eye: Secondary | ICD-10-CM | POA: Diagnosis not present

## 2020-03-05 DIAGNOSIS — H2512 Age-related nuclear cataract, left eye: Secondary | ICD-10-CM | POA: Diagnosis not present

## 2020-03-08 DIAGNOSIS — H2512 Age-related nuclear cataract, left eye: Secondary | ICD-10-CM | POA: Diagnosis not present

## 2020-03-14 DIAGNOSIS — I10 Essential (primary) hypertension: Secondary | ICD-10-CM | POA: Diagnosis not present

## 2020-03-14 DIAGNOSIS — E039 Hypothyroidism, unspecified: Secondary | ICD-10-CM | POA: Diagnosis not present

## 2020-03-16 DIAGNOSIS — I371 Nonrheumatic pulmonary valve insufficiency: Secondary | ICD-10-CM | POA: Diagnosis not present

## 2020-03-16 DIAGNOSIS — M4802 Spinal stenosis, cervical region: Secondary | ICD-10-CM | POA: Diagnosis not present

## 2020-03-16 DIAGNOSIS — I340 Nonrheumatic mitral (valve) insufficiency: Secondary | ICD-10-CM | POA: Diagnosis not present

## 2020-03-16 DIAGNOSIS — I351 Nonrheumatic aortic (valve) insufficiency: Secondary | ICD-10-CM | POA: Diagnosis not present

## 2020-03-16 DIAGNOSIS — M47812 Spondylosis without myelopathy or radiculopathy, cervical region: Secondary | ICD-10-CM | POA: Diagnosis not present

## 2020-03-26 DIAGNOSIS — M21722 Unequal limb length (acquired), left humerus: Secondary | ICD-10-CM | POA: Diagnosis not present

## 2020-03-26 DIAGNOSIS — S92331A Displaced fracture of third metatarsal bone, right foot, initial encounter for closed fracture: Secondary | ICD-10-CM | POA: Diagnosis not present

## 2020-03-26 DIAGNOSIS — R262 Difficulty in walking, not elsewhere classified: Secondary | ICD-10-CM | POA: Diagnosis not present

## 2020-03-26 DIAGNOSIS — M79671 Pain in right foot: Secondary | ICD-10-CM | POA: Diagnosis not present

## 2020-03-26 DIAGNOSIS — B351 Tinea unguium: Secondary | ICD-10-CM | POA: Diagnosis not present

## 2020-03-26 DIAGNOSIS — S92344A Nondisplaced fracture of fourth metatarsal bone, right foot, initial encounter for closed fracture: Secondary | ICD-10-CM | POA: Diagnosis not present

## 2020-04-10 DIAGNOSIS — H259 Unspecified age-related cataract: Secondary | ICD-10-CM | POA: Diagnosis not present

## 2020-04-10 DIAGNOSIS — R0602 Shortness of breath: Secondary | ICD-10-CM | POA: Diagnosis not present

## 2020-04-10 DIAGNOSIS — M79672 Pain in left foot: Secondary | ICD-10-CM | POA: Diagnosis not present

## 2020-04-10 DIAGNOSIS — I350 Nonrheumatic aortic (valve) stenosis: Secondary | ICD-10-CM | POA: Diagnosis not present

## 2020-04-10 DIAGNOSIS — M810 Age-related osteoporosis without current pathological fracture: Secondary | ICD-10-CM | POA: Diagnosis not present

## 2020-04-10 DIAGNOSIS — I10 Essential (primary) hypertension: Secondary | ICD-10-CM | POA: Diagnosis not present

## 2020-04-24 DIAGNOSIS — I10 Essential (primary) hypertension: Secondary | ICD-10-CM | POA: Diagnosis not present

## 2020-04-24 DIAGNOSIS — Z79890 Hormone replacement therapy (postmenopausal): Secondary | ICD-10-CM | POA: Diagnosis not present

## 2020-04-24 DIAGNOSIS — I161 Hypertensive emergency: Secondary | ICD-10-CM | POA: Diagnosis not present

## 2020-04-24 DIAGNOSIS — I712 Thoracic aortic aneurysm, without rupture: Secondary | ICD-10-CM | POA: Diagnosis not present

## 2020-04-24 DIAGNOSIS — J449 Chronic obstructive pulmonary disease, unspecified: Secondary | ICD-10-CM | POA: Diagnosis not present

## 2020-04-24 DIAGNOSIS — G629 Polyneuropathy, unspecified: Secondary | ICD-10-CM | POA: Diagnosis not present

## 2020-04-24 DIAGNOSIS — N400 Enlarged prostate without lower urinary tract symptoms: Secondary | ICD-10-CM | POA: Diagnosis not present

## 2020-04-24 DIAGNOSIS — R079 Chest pain, unspecified: Secondary | ICD-10-CM | POA: Diagnosis not present

## 2020-04-24 DIAGNOSIS — K219 Gastro-esophageal reflux disease without esophagitis: Secondary | ICD-10-CM | POA: Diagnosis not present

## 2020-04-24 DIAGNOSIS — I351 Nonrheumatic aortic (valve) insufficiency: Secondary | ICD-10-CM | POA: Diagnosis not present

## 2020-04-24 DIAGNOSIS — I77810 Thoracic aortic ectasia: Secondary | ICD-10-CM | POA: Diagnosis not present

## 2020-04-24 DIAGNOSIS — E039 Hypothyroidism, unspecified: Secondary | ICD-10-CM | POA: Diagnosis not present

## 2020-04-24 DIAGNOSIS — F329 Major depressive disorder, single episode, unspecified: Secondary | ICD-10-CM | POA: Diagnosis not present

## 2020-04-24 DIAGNOSIS — Z7982 Long term (current) use of aspirin: Secondary | ICD-10-CM | POA: Diagnosis not present

## 2020-04-24 DIAGNOSIS — I350 Nonrheumatic aortic (valve) stenosis: Secondary | ICD-10-CM | POA: Diagnosis not present

## 2020-04-24 DIAGNOSIS — G2581 Restless legs syndrome: Secondary | ICD-10-CM | POA: Diagnosis not present

## 2020-04-24 DIAGNOSIS — Z87891 Personal history of nicotine dependence: Secondary | ICD-10-CM | POA: Diagnosis not present

## 2020-04-24 DIAGNOSIS — R0989 Other specified symptoms and signs involving the circulatory and respiratory systems: Secondary | ICD-10-CM | POA: Diagnosis not present

## 2020-04-25 DIAGNOSIS — I712 Thoracic aortic aneurysm, without rupture: Secondary | ICD-10-CM | POA: Diagnosis not present

## 2020-04-25 DIAGNOSIS — I10 Essential (primary) hypertension: Secondary | ICD-10-CM | POA: Diagnosis not present

## 2020-04-25 DIAGNOSIS — K219 Gastro-esophageal reflux disease without esophagitis: Secondary | ICD-10-CM | POA: Diagnosis not present

## 2020-04-25 DIAGNOSIS — R079 Chest pain, unspecified: Secondary | ICD-10-CM | POA: Diagnosis not present

## 2020-04-25 DIAGNOSIS — G2581 Restless legs syndrome: Secondary | ICD-10-CM | POA: Diagnosis not present

## 2020-04-25 DIAGNOSIS — I77810 Thoracic aortic ectasia: Secondary | ICD-10-CM | POA: Diagnosis not present

## 2020-04-25 DIAGNOSIS — I161 Hypertensive emergency: Secondary | ICD-10-CM | POA: Diagnosis not present

## 2020-04-25 DIAGNOSIS — I351 Nonrheumatic aortic (valve) insufficiency: Secondary | ICD-10-CM | POA: Diagnosis not present

## 2020-04-26 DIAGNOSIS — I351 Nonrheumatic aortic (valve) insufficiency: Secondary | ICD-10-CM | POA: Diagnosis not present

## 2020-04-26 DIAGNOSIS — R079 Chest pain, unspecified: Secondary | ICD-10-CM | POA: Diagnosis not present

## 2020-04-26 DIAGNOSIS — I161 Hypertensive emergency: Secondary | ICD-10-CM | POA: Diagnosis not present

## 2020-04-26 DIAGNOSIS — I77810 Thoracic aortic ectasia: Secondary | ICD-10-CM | POA: Diagnosis not present

## 2020-04-26 DIAGNOSIS — K219 Gastro-esophageal reflux disease without esophagitis: Secondary | ICD-10-CM | POA: Diagnosis not present

## 2020-04-26 DIAGNOSIS — G2581 Restless legs syndrome: Secondary | ICD-10-CM | POA: Diagnosis not present

## 2020-04-26 DIAGNOSIS — I712 Thoracic aortic aneurysm, without rupture: Secondary | ICD-10-CM | POA: Diagnosis not present

## 2020-04-26 DIAGNOSIS — I10 Essential (primary) hypertension: Secondary | ICD-10-CM | POA: Diagnosis not present

## 2020-05-01 DIAGNOSIS — R079 Chest pain, unspecified: Secondary | ICD-10-CM | POA: Diagnosis not present

## 2020-05-01 DIAGNOSIS — I350 Nonrheumatic aortic (valve) stenosis: Secondary | ICD-10-CM | POA: Diagnosis not present

## 2020-05-01 DIAGNOSIS — I10 Essential (primary) hypertension: Secondary | ICD-10-CM | POA: Diagnosis not present

## 2020-05-01 DIAGNOSIS — I77810 Thoracic aortic ectasia: Secondary | ICD-10-CM | POA: Diagnosis not present

## 2020-05-16 DIAGNOSIS — I25118 Atherosclerotic heart disease of native coronary artery with other forms of angina pectoris: Secondary | ICD-10-CM | POA: Diagnosis not present

## 2020-05-16 DIAGNOSIS — E559 Vitamin D deficiency, unspecified: Secondary | ICD-10-CM | POA: Diagnosis not present

## 2020-05-16 DIAGNOSIS — I712 Thoracic aortic aneurysm, without rupture: Secondary | ICD-10-CM | POA: Diagnosis not present

## 2020-05-16 DIAGNOSIS — E7800 Pure hypercholesterolemia, unspecified: Secondary | ICD-10-CM | POA: Diagnosis not present

## 2020-05-16 DIAGNOSIS — I10 Essential (primary) hypertension: Secondary | ICD-10-CM | POA: Diagnosis not present

## 2020-05-16 DIAGNOSIS — G4733 Obstructive sleep apnea (adult) (pediatric): Secondary | ICD-10-CM | POA: Diagnosis not present

## 2020-06-21 DIAGNOSIS — R9439 Abnormal result of other cardiovascular function study: Secondary | ICD-10-CM | POA: Diagnosis not present

## 2020-06-21 DIAGNOSIS — I25118 Atherosclerotic heart disease of native coronary artery with other forms of angina pectoris: Secondary | ICD-10-CM | POA: Diagnosis not present

## 2020-07-02 DIAGNOSIS — E6609 Other obesity due to excess calories: Secondary | ICD-10-CM | POA: Diagnosis not present

## 2020-07-02 DIAGNOSIS — I712 Thoracic aortic aneurysm, without rupture: Secondary | ICD-10-CM | POA: Diagnosis not present

## 2020-07-02 DIAGNOSIS — Z6833 Body mass index (BMI) 33.0-33.9, adult: Secondary | ICD-10-CM | POA: Diagnosis not present

## 2020-07-02 DIAGNOSIS — I2510 Atherosclerotic heart disease of native coronary artery without angina pectoris: Secondary | ICD-10-CM | POA: Diagnosis not present

## 2020-07-02 DIAGNOSIS — I119 Hypertensive heart disease without heart failure: Secondary | ICD-10-CM | POA: Diagnosis not present

## 2020-07-02 DIAGNOSIS — I352 Nonrheumatic aortic (valve) stenosis with insufficiency: Secondary | ICD-10-CM | POA: Diagnosis not present

## 2020-07-02 DIAGNOSIS — E7800 Pure hypercholesterolemia, unspecified: Secondary | ICD-10-CM | POA: Diagnosis not present

## 2020-07-12 DIAGNOSIS — H02831 Dermatochalasis of right upper eyelid: Secondary | ICD-10-CM | POA: Diagnosis not present

## 2020-07-12 DIAGNOSIS — H40013 Open angle with borderline findings, low risk, bilateral: Secondary | ICD-10-CM | POA: Diagnosis not present

## 2020-07-12 DIAGNOSIS — Z961 Presence of intraocular lens: Secondary | ICD-10-CM | POA: Diagnosis not present

## 2020-07-12 DIAGNOSIS — H1045 Other chronic allergic conjunctivitis: Secondary | ICD-10-CM | POA: Diagnosis not present

## 2020-09-20 DIAGNOSIS — L719 Rosacea, unspecified: Secondary | ICD-10-CM | POA: Diagnosis not present

## 2020-09-20 DIAGNOSIS — L219 Seborrheic dermatitis, unspecified: Secondary | ICD-10-CM | POA: Diagnosis not present

## 2020-09-20 DIAGNOSIS — L814 Other melanin hyperpigmentation: Secondary | ICD-10-CM | POA: Diagnosis not present

## 2020-09-26 ENCOUNTER — Other Ambulatory Visit: Payer: Self-pay | Admitting: Cardiothoracic Surgery

## 2020-09-26 DIAGNOSIS — I7121 Aneurysm of the ascending aorta, without rupture: Secondary | ICD-10-CM

## 2020-09-26 DIAGNOSIS — I712 Thoracic aortic aneurysm, without rupture, unspecified: Secondary | ICD-10-CM

## 2020-09-27 DIAGNOSIS — R3915 Urgency of urination: Secondary | ICD-10-CM | POA: Diagnosis not present

## 2020-09-27 DIAGNOSIS — R3912 Poor urinary stream: Secondary | ICD-10-CM | POA: Diagnosis not present

## 2020-09-27 DIAGNOSIS — R350 Frequency of micturition: Secondary | ICD-10-CM | POA: Diagnosis not present

## 2020-09-27 DIAGNOSIS — E039 Hypothyroidism, unspecified: Secondary | ICD-10-CM | POA: Diagnosis not present

## 2020-09-27 DIAGNOSIS — I10 Essential (primary) hypertension: Secondary | ICD-10-CM | POA: Diagnosis not present

## 2020-09-27 DIAGNOSIS — N401 Enlarged prostate with lower urinary tract symptoms: Secondary | ICD-10-CM | POA: Diagnosis not present

## 2020-09-27 DIAGNOSIS — I712 Thoracic aortic aneurysm, without rupture: Secondary | ICD-10-CM | POA: Diagnosis not present

## 2020-09-27 DIAGNOSIS — J439 Emphysema, unspecified: Secondary | ICD-10-CM | POA: Diagnosis not present

## 2020-10-10 ENCOUNTER — Ambulatory Visit: Payer: PPO | Admitting: Cardiothoracic Surgery

## 2020-10-12 DIAGNOSIS — J441 Chronic obstructive pulmonary disease with (acute) exacerbation: Secondary | ICD-10-CM | POA: Diagnosis not present

## 2020-10-12 DIAGNOSIS — Z20822 Contact with and (suspected) exposure to covid-19: Secondary | ICD-10-CM | POA: Diagnosis not present

## 2020-10-17 ENCOUNTER — Ambulatory Visit
Admission: RE | Admit: 2020-10-17 | Discharge: 2020-10-17 | Disposition: A | Discharge status: Home / Self Care | Payer: PPO | Source: Ambulatory Visit | Attending: Cardiothoracic Surgery | Admitting: Cardiothoracic Surgery

## 2020-10-17 ENCOUNTER — Encounter: Payer: Self-pay | Admitting: Cardiothoracic Surgery

## 2020-10-17 ENCOUNTER — Other Ambulatory Visit: Payer: Self-pay

## 2020-10-17 ENCOUNTER — Ambulatory Visit: Payer: PPO | Admitting: Cardiothoracic Surgery

## 2020-10-17 VITALS — BP 152/91 | HR 84 | Resp 20 | Wt 203.0 lb

## 2020-10-17 DIAGNOSIS — I712 Thoracic aortic aneurysm, without rupture, unspecified: Secondary | ICD-10-CM

## 2020-10-17 DIAGNOSIS — M314 Aortic arch syndrome [Takayasu]: Secondary | ICD-10-CM

## 2020-10-17 DIAGNOSIS — I2510 Atherosclerotic heart disease of native coronary artery without angina pectoris: Secondary | ICD-10-CM | POA: Diagnosis not present

## 2020-10-17 DIAGNOSIS — J929 Pleural plaque without asbestos: Secondary | ICD-10-CM | POA: Diagnosis not present

## 2020-10-17 DIAGNOSIS — J439 Emphysema, unspecified: Secondary | ICD-10-CM | POA: Diagnosis not present

## 2020-10-17 HISTORY — DX: Aortic arch syndrome (takayasu): M31.4

## 2020-10-17 MED ORDER — METOPROLOL SUCCINATE ER 25 MG PO TB24: 25.0000 mg | ORAL_TABLET | Freq: Every day | ORAL | 3 refills | Status: AC

## 2020-10-17 MED ORDER — IOPAMIDOL (ISOVUE-370) INJECTION 76%
75.0000 mL | Freq: Once | INTRAVENOUS | Status: AC | PRN
  Administered 2020-10-17: 75 mL via INTRAVENOUS

## 2020-10-17 NOTE — Progress Notes (Signed)
PCP is Nodal, Alphonzo Dublin, PA-C Referring Provider is Gardiner Rhyme, MD  Chief Complaint  Patient presents with  . Thoracic Aortic Aneurysm    CTA of chest today    HPI: Patient returns with CTA for follow-up of a asymptomatic fusiform ascending aortic aneurysm stable at 4.6 cm since 2017.  Patient has history of hypertension and remote history of smoking.  No family history of aortic dissection.  Echocardiogram in 2017 showed normal EF with aortic sclerosis.  The patient has been in [place] for a few months and unfortunately stopped taking his Toprol.  Today his blood pressure is high but he has been off the medication.  I refilled the Toprol-XL 25 mg with several refills and he understands importance of continuing that medication to reduce the risk of further dilatation of the aorta and reduce the risk of aortic tear.  The patient states he did have an episode of chest pain earlier this year in [place] and was hospitalized.  He was told he did not have an MI.  He states he had an echocardiogram and CT scan and was told they were fine.  Past Medical History:  Diagnosis Date  . Allergic rhinitis   . Aortic valve disorder    "it's enlarged; works fine" (03/30/2018)  . Arthritis    "knuckes, right knee" 9(03/30/2018)  . BPH (benign prostatic hypertrophy)   . Chest pain   . COPD (chronic obstructive pulmonary disease) ([place])   . Dyspnea   . GERD (gastroesophageal reflux disease)   . Headache    "used to have them alot; went away mostly after I quit smoking" (03/30/2018)  . Heart murmur   . History of blood transfusion 1949   "related to farm tractor accident"  . History of kidney stones    MORE THAN 10 YRS AGO  . Hypertension   . Hypothyroidism   . Hypothyroidism   . Pneumonia    "once" (03/30/2018)  . PONV (postoperative nausea and vomiting)   . Sleep apnea    DID NOT GO BACK FOR 2ND TEST.  NO MACHINE    Past Surgical History:  Procedure Laterality Date  . ANKLE SURGERY Left  1949   "DEFORMED"  D/T TRACTOR ACCIDENT  . BACK SURGERY    . FIXATION KYPHOPLASTY LUMBAR SPINE  2013  . FRACTURE SURGERY    . JOINT REPLACEMENT    . TONSILLECTOMY    . TOTAL KNEE ARTHROPLASTY Right 03/29/2018   Procedure: RIGHT TOTAL KNEE ARTHROPLASTY;  Surgeon: Vickey Huger, MD;  Location: [facility];  Service: Orthopedics;  Laterality: Right;    Family History  Problem Relation Age of Onset  . Hyperlipidemia Mother   . Hypertension Mother   . Stroke Mother   . Leukemia Mother   . Cancer Father   . Heart disease Father   . Early death Father   . Cancer Sister   . Kidney disease Brother   . Heart disease Maternal Grandmother   . Kidney disease Maternal Grandmother   . Heart disease Maternal Grandfather     Social History Social History   Tobacco Use  . Smoking status: Former Smoker    Packs/day: 1.00    Years: 25.00    Pack years: 25.00    Types: Cigarettes    Quit date: 12/16/1987    Years since quitting: 32.8  . Smokeless tobacco: Never Used  Vaping Use  . Vaping Use: Never used  Substance Use Topics  . Alcohol use: Yes  Alcohol/week: 4.0 standard drinks    Types: 4 Cans of beer per week  . Drug use: No    Current Outpatient Medications  Medication Sig Dispense Refill  . acetaminophen (TYLENOL) 500 MG tablet Take 500-1,000 mg by mouth every 6 (six) hours as needed (for pain.).    Marland Kitchen aspirin EC 81 MG tablet Take 81 mg by mouth daily.    . cetirizine (ZYRTEC) 10 MG tablet Take 10 mg by mouth daily after breakfast.    . Cyanocobalamin (VITAMIN B-12) 3000 MCG SUBL Place 3,000 mcg under the tongue 2 (two) times daily after a meal.    . dutasteride (AVODART) 0.5 MG capsule Take 0.5 mg by mouth daily after breakfast.     . gabapentin (NEURONTIN) 100 MG capsule Take 100-200 mg by mouth at bedtime as needed (for pain.).    Marland Kitchen levothyroxine (SYNTHROID, LEVOTHROID) 125 MCG tablet Take 125 mcg by mouth daily before breakfast.  0  . meloxicam (MOBIC) 15 MG tablet Take 15 mg by  mouth daily.    Marland Kitchen omeprazole (PRILOSEC) 40 MG capsule Take 40 mg by mouth daily before breakfast.  2  . rOPINIRole (REQUIP) 4 MG tablet Take 4 mg by mouth at bedtime.  3  . senna (SENOKOT) 8.6 MG TABS tablet Take 1 tablet (8.6 mg total) by mouth daily. 120 each 0  . sildenafil (REVATIO) 20 MG tablet Take 20 mg by mouth 3 (three) times daily.    Marland Kitchen tolterodine (DETROL LA) 4 MG 24 hr capsule Take 4 mg by mouth daily after breakfast.  11  . metoprolol succinate (TOPROL XL) 25 MG 24 hr tablet Take 1 tablet (25 mg total) by mouth daily. 90 tablet 3   No current facility-administered medications for this visit.    No Known Allergies  Review of Systems  Weight stable Difficulty walking because of arthritis in his knees and left ankle Complains of leg cramps at night but denies symptoms of claudication while ambulating Patient vaccinated against influenza No symptoms of COVID-19 bronchitis pneumonitis  BP (!) 152/91 (BP Location: Left Arm, Patient Position: Sitting)   Pulse 84   Resp 20   Wt 203 lb (92.1 kg)   SpO2 91% Comment: RA with mask on  BMI 29.13 kg/m  Physical Exam      Exam    General- alert and comfortable    Neck- no JVD, no cervical adenopathy palpable, no carotid bruit   Lungs- clear without rales, wheezes   Cor- regular rate and rhythm, no murmur , gallop   Abdomen- soft, non-tender   Extremities - warm, non-tender, minimal edema.  Pulses palpable bilaterally.   Neuro- oriented, appropriate, no focal weakness   Diagnostic Tests: Images of CTA personally reviewed and discussed with patient.  The ascending aorta remains stable at 4.6 cm diameter.  The aortic wall   is smooth without mural thickening or ulceration.  He has chronic pulmonary changes that are stable, small pulmonary nodule and mild mediastinal adenopathy.  Impression: Asymptomatic fusiform ascending thoracic aneurysm in an 79 year old man with hypertension.  Aorta has been stable at 4.6 cm since 2017.  I  feel he is at low risk for aortic dissection as long as he continues to take his blood pressure medication and he understands that clearly.  We will have him come back in 18 months for reassessment with a CTA. Also discussed the importance of a heart healthy lifestyle with regular aerobic exercise and heart healthy diet as he is at increased  risk for coronary disease as well. Plan: Return with CTA in 18 months.  Continue blood pressure medication as discussed above.   Len Childs, MD [facility] [phone]

## 2020-10-18 DIAGNOSIS — Z8781 Personal history of (healed) traumatic fracture: Secondary | ICD-10-CM | POA: Diagnosis not present

## 2020-10-18 DIAGNOSIS — M810 Age-related osteoporosis without current pathological fracture: Secondary | ICD-10-CM | POA: Diagnosis not present

## 2020-10-24 DIAGNOSIS — Z8781 Personal history of (healed) traumatic fracture: Secondary | ICD-10-CM | POA: Diagnosis not present

## 2020-10-24 DIAGNOSIS — M48061 Spinal stenosis, lumbar region without neurogenic claudication: Secondary | ICD-10-CM | POA: Diagnosis not present

## 2020-10-26 DIAGNOSIS — R351 Nocturia: Secondary | ICD-10-CM | POA: Diagnosis not present

## 2020-10-26 DIAGNOSIS — N401 Enlarged prostate with lower urinary tract symptoms: Secondary | ICD-10-CM | POA: Diagnosis not present

## 2020-10-26 DIAGNOSIS — R350 Frequency of micturition: Secondary | ICD-10-CM | POA: Diagnosis not present

## 2020-11-05 DIAGNOSIS — Z87891 Personal history of nicotine dependence: Secondary | ICD-10-CM | POA: Diagnosis not present

## 2020-11-05 DIAGNOSIS — J439 Emphysema, unspecified: Secondary | ICD-10-CM | POA: Diagnosis not present

## 2020-11-29 DIAGNOSIS — N401 Enlarged prostate with lower urinary tract symptoms: Secondary | ICD-10-CM | POA: Diagnosis not present

## 2020-11-29 DIAGNOSIS — R351 Nocturia: Secondary | ICD-10-CM | POA: Diagnosis not present

## 2020-11-29 DIAGNOSIS — R3915 Urgency of urination: Secondary | ICD-10-CM | POA: Diagnosis not present

## 2020-12-02 DIAGNOSIS — S52502A Unspecified fracture of the lower end of left radius, initial encounter for closed fracture: Secondary | ICD-10-CM | POA: Diagnosis not present

## 2020-12-02 DIAGNOSIS — I6782 Cerebral ischemia: Secondary | ICD-10-CM | POA: Diagnosis not present

## 2020-12-02 DIAGNOSIS — S52592A Other fractures of lower end of left radius, initial encounter for closed fracture: Secondary | ICD-10-CM | POA: Diagnosis not present

## 2020-12-02 DIAGNOSIS — S199XXA Unspecified injury of neck, initial encounter: Secondary | ICD-10-CM | POA: Diagnosis not present

## 2020-12-02 DIAGNOSIS — I361 Nonrheumatic tricuspid (valve) insufficiency: Secondary | ICD-10-CM | POA: Diagnosis not present

## 2020-12-02 DIAGNOSIS — Z8701 Personal history of pneumonia (recurrent): Secondary | ICD-10-CM | POA: Diagnosis not present

## 2020-12-02 DIAGNOSIS — Z79899 Other long term (current) drug therapy: Secondary | ICD-10-CM | POA: Diagnosis not present

## 2020-12-02 DIAGNOSIS — I711 Thoracic aortic aneurysm, ruptured: Secondary | ICD-10-CM | POA: Diagnosis not present

## 2020-12-02 DIAGNOSIS — J439 Emphysema, unspecified: Secondary | ICD-10-CM | POA: Diagnosis not present

## 2020-12-02 DIAGNOSIS — K449 Diaphragmatic hernia without obstruction or gangrene: Secondary | ICD-10-CM | POA: Diagnosis not present

## 2020-12-02 DIAGNOSIS — I214 Non-ST elevation (NSTEMI) myocardial infarction: Secondary | ICD-10-CM | POA: Diagnosis not present

## 2020-12-02 DIAGNOSIS — G319 Degenerative disease of nervous system, unspecified: Secondary | ICD-10-CM | POA: Diagnosis not present

## 2020-12-02 DIAGNOSIS — M25532 Pain in left wrist: Secondary | ICD-10-CM | POA: Diagnosis not present

## 2020-12-02 DIAGNOSIS — S0990XA Unspecified injury of head, initial encounter: Secondary | ICD-10-CM | POA: Diagnosis not present

## 2020-12-02 DIAGNOSIS — I712 Thoracic aortic aneurysm, without rupture: Secondary | ICD-10-CM | POA: Diagnosis not present

## 2020-12-02 DIAGNOSIS — Z7982 Long term (current) use of aspirin: Secondary | ICD-10-CM | POA: Diagnosis not present

## 2020-12-02 DIAGNOSIS — I350 Nonrheumatic aortic (valve) stenosis: Secondary | ICD-10-CM | POA: Diagnosis not present

## 2020-12-02 DIAGNOSIS — R55 Syncope and collapse: Secondary | ICD-10-CM | POA: Diagnosis not present

## 2020-12-02 DIAGNOSIS — I10 Essential (primary) hypertension: Secondary | ICD-10-CM | POA: Diagnosis not present

## 2020-12-02 DIAGNOSIS — I248 Other forms of acute ischemic heart disease: Secondary | ICD-10-CM | POA: Diagnosis not present

## 2020-12-02 DIAGNOSIS — Z87891 Personal history of nicotine dependence: Secondary | ICD-10-CM | POA: Diagnosis not present

## 2020-12-02 DIAGNOSIS — J9621 Acute and chronic respiratory failure with hypoxia: Secondary | ICD-10-CM | POA: Diagnosis not present

## 2020-12-02 DIAGNOSIS — J9811 Atelectasis: Secondary | ICD-10-CM | POA: Diagnosis not present

## 2020-12-02 DIAGNOSIS — I517 Cardiomegaly: Secondary | ICD-10-CM | POA: Diagnosis not present

## 2020-12-02 DIAGNOSIS — I352 Nonrheumatic aortic (valve) stenosis with insufficiency: Secondary | ICD-10-CM | POA: Diagnosis not present

## 2020-12-02 DIAGNOSIS — R7989 Other specified abnormal findings of blood chemistry: Secondary | ICD-10-CM | POA: Diagnosis not present

## 2020-12-02 DIAGNOSIS — R109 Unspecified abdominal pain: Secondary | ICD-10-CM | POA: Diagnosis not present

## 2020-12-02 DIAGNOSIS — R911 Solitary pulmonary nodule: Secondary | ICD-10-CM | POA: Diagnosis not present

## 2020-12-02 DIAGNOSIS — J449 Chronic obstructive pulmonary disease, unspecified: Secondary | ICD-10-CM | POA: Diagnosis not present

## 2020-12-02 DIAGNOSIS — G4700 Insomnia, unspecified: Secondary | ICD-10-CM | POA: Diagnosis not present

## 2020-12-02 DIAGNOSIS — K8020 Calculus of gallbladder without cholecystitis without obstruction: Secondary | ICD-10-CM | POA: Diagnosis not present

## 2020-12-02 DIAGNOSIS — K219 Gastro-esophageal reflux disease without esophagitis: Secondary | ICD-10-CM | POA: Diagnosis not present

## 2020-12-02 DIAGNOSIS — R778 Other specified abnormalities of plasma proteins: Secondary | ICD-10-CM | POA: Diagnosis not present

## 2020-12-02 DIAGNOSIS — E039 Hypothyroidism, unspecified: Secondary | ICD-10-CM | POA: Diagnosis not present

## 2020-12-02 DIAGNOSIS — M1990 Unspecified osteoarthritis, unspecified site: Secondary | ICD-10-CM | POA: Diagnosis not present

## 2020-12-02 DIAGNOSIS — N400 Enlarged prostate without lower urinary tract symptoms: Secondary | ICD-10-CM | POA: Diagnosis not present

## 2020-12-02 DIAGNOSIS — M47812 Spondylosis without myelopathy or radiculopathy, cervical region: Secondary | ICD-10-CM | POA: Diagnosis not present

## 2020-12-02 DIAGNOSIS — G2581 Restless legs syndrome: Secondary | ICD-10-CM | POA: Diagnosis not present

## 2020-12-03 DIAGNOSIS — R55 Syncope and collapse: Secondary | ICD-10-CM | POA: Diagnosis not present

## 2020-12-03 DIAGNOSIS — R7989 Other specified abnormal findings of blood chemistry: Secondary | ICD-10-CM | POA: Diagnosis not present

## 2020-12-03 DIAGNOSIS — J449 Chronic obstructive pulmonary disease, unspecified: Secondary | ICD-10-CM | POA: Diagnosis not present

## 2020-12-03 DIAGNOSIS — I712 Thoracic aortic aneurysm, without rupture: Secondary | ICD-10-CM | POA: Diagnosis not present

## 2020-12-03 DIAGNOSIS — I361 Nonrheumatic tricuspid (valve) insufficiency: Secondary | ICD-10-CM | POA: Diagnosis not present

## 2020-12-03 DIAGNOSIS — R778 Other specified abnormalities of plasma proteins: Secondary | ICD-10-CM | POA: Diagnosis not present

## 2020-12-03 DIAGNOSIS — I352 Nonrheumatic aortic (valve) stenosis with insufficiency: Secondary | ICD-10-CM | POA: Diagnosis not present

## 2020-12-04 DIAGNOSIS — J449 Chronic obstructive pulmonary disease, unspecified: Secondary | ICD-10-CM | POA: Diagnosis not present

## 2020-12-04 DIAGNOSIS — R7989 Other specified abnormal findings of blood chemistry: Secondary | ICD-10-CM | POA: Diagnosis not present

## 2020-12-04 DIAGNOSIS — I712 Thoracic aortic aneurysm, without rupture: Secondary | ICD-10-CM | POA: Diagnosis not present

## 2020-12-04 DIAGNOSIS — R778 Other specified abnormalities of plasma proteins: Secondary | ICD-10-CM | POA: Diagnosis not present

## 2020-12-04 DIAGNOSIS — I350 Nonrheumatic aortic (valve) stenosis: Secondary | ICD-10-CM

## 2020-12-05 DIAGNOSIS — J9611 Chronic respiratory failure with hypoxia: Secondary | ICD-10-CM | POA: Diagnosis not present

## 2020-12-05 DIAGNOSIS — K219 Gastro-esophageal reflux disease without esophagitis: Secondary | ICD-10-CM | POA: Diagnosis not present

## 2020-12-05 DIAGNOSIS — I350 Nonrheumatic aortic (valve) stenosis: Secondary | ICD-10-CM | POA: Diagnosis not present

## 2020-12-05 DIAGNOSIS — Z87891 Personal history of nicotine dependence: Secondary | ICD-10-CM | POA: Diagnosis not present

## 2020-12-05 DIAGNOSIS — G2581 Restless legs syndrome: Secondary | ICD-10-CM | POA: Diagnosis not present

## 2020-12-05 DIAGNOSIS — S52502A Unspecified fracture of the lower end of left radius, initial encounter for closed fracture: Secondary | ICD-10-CM | POA: Diagnosis not present

## 2020-12-05 DIAGNOSIS — M1991 Primary osteoarthritis, unspecified site: Secondary | ICD-10-CM | POA: Diagnosis not present

## 2020-12-05 DIAGNOSIS — J432 Centrilobular emphysema: Secondary | ICD-10-CM | POA: Diagnosis not present

## 2020-12-05 DIAGNOSIS — S52502D Unspecified fracture of the lower end of left radius, subsequent encounter for closed fracture with routine healing: Secondary | ICD-10-CM | POA: Diagnosis not present

## 2020-12-05 DIAGNOSIS — Z9981 Dependence on supplemental oxygen: Secondary | ICD-10-CM | POA: Diagnosis not present

## 2020-12-05 DIAGNOSIS — E039 Hypothyroidism, unspecified: Secondary | ICD-10-CM | POA: Diagnosis not present

## 2020-12-05 DIAGNOSIS — Z7982 Long term (current) use of aspirin: Secondary | ICD-10-CM | POA: Diagnosis not present

## 2020-12-05 DIAGNOSIS — Z9181 History of falling: Secondary | ICD-10-CM | POA: Diagnosis not present

## 2020-12-05 DIAGNOSIS — N400 Enlarged prostate without lower urinary tract symptoms: Secondary | ICD-10-CM | POA: Diagnosis not present

## 2020-12-05 DIAGNOSIS — I10 Essential (primary) hypertension: Secondary | ICD-10-CM | POA: Diagnosis not present

## 2020-12-05 DIAGNOSIS — K8020 Calculus of gallbladder without cholecystitis without obstruction: Secondary | ICD-10-CM | POA: Diagnosis not present

## 2020-12-05 DIAGNOSIS — G4700 Insomnia, unspecified: Secondary | ICD-10-CM | POA: Diagnosis not present

## 2020-12-05 DIAGNOSIS — I712 Thoracic aortic aneurysm, without rupture: Secondary | ICD-10-CM | POA: Diagnosis not present

## 2020-12-11 DIAGNOSIS — S52592A Other fractures of lower end of left radius, initial encounter for closed fracture: Secondary | ICD-10-CM | POA: Diagnosis not present

## 2020-12-24 DIAGNOSIS — G4730 Sleep apnea, unspecified: Secondary | ICD-10-CM | POA: Insufficient documentation

## 2020-12-24 DIAGNOSIS — Z9889 Other specified postprocedural states: Secondary | ICD-10-CM | POA: Insufficient documentation

## 2020-12-24 DIAGNOSIS — I359 Nonrheumatic aortic valve disorder, unspecified: Secondary | ICD-10-CM | POA: Insufficient documentation

## 2020-12-24 DIAGNOSIS — J189 Pneumonia, unspecified organism: Secondary | ICD-10-CM | POA: Insufficient documentation

## 2020-12-24 DIAGNOSIS — Z87442 Personal history of urinary calculi: Secondary | ICD-10-CM | POA: Insufficient documentation

## 2020-12-24 DIAGNOSIS — R112 Nausea with vomiting, unspecified: Secondary | ICD-10-CM | POA: Insufficient documentation

## 2020-12-24 DIAGNOSIS — R519 Headache, unspecified: Secondary | ICD-10-CM | POA: Insufficient documentation

## 2020-12-24 DIAGNOSIS — K219 Gastro-esophageal reflux disease without esophagitis: Secondary | ICD-10-CM | POA: Insufficient documentation

## 2020-12-24 DIAGNOSIS — R011 Cardiac murmur, unspecified: Secondary | ICD-10-CM | POA: Insufficient documentation

## 2020-12-24 DIAGNOSIS — M1990 Unspecified osteoarthritis, unspecified site: Secondary | ICD-10-CM | POA: Insufficient documentation

## 2020-12-24 DIAGNOSIS — R0600 Dyspnea, unspecified: Secondary | ICD-10-CM | POA: Insufficient documentation

## 2020-12-31 ENCOUNTER — Ambulatory Visit: Payer: Self-pay | Admitting: Cardiology

## 2020-12-31 NOTE — Progress Notes (Deleted)
{Choose 1 Note Type (Video or Telephone):[phone]}    Date:  12/31/2020   ID:  Jeffrey Mahoney, DOB [birth year], MRN [id number] The patient was identified using 2 identifiers.  {Patient [phone]::"Home"} {Provider Location:[phone]::"Home Office"}  PCP:  Nodal, Alphonzo Dublin, PA-C  Cardiologist:  No primary care provider on file. *** Electrophysiologist:  None   Evaluation Performed:  {Choose Visit Type:[phone]::"Follow-Up Visit"}  Chief Complaint:  ***  History of Present Illness:    Jeffrey Mahoney is a 80 y.o. male with recent [facility] admission 12/02/2020 with fall and fracture of the left radius elevated troponin and proBNP level emphysema syncope mild aortic stenosis COPD and stable ascending aortic aneurysm.  Chart review [facility] pulmonary consultation 11/05/2020 reveals a history of hypothyroidism unspecified cardiac arrhythmia PVCs hypothyroidism and COPD.  He was diagnosed with severe COPD and was placed on bronchodilator. There is a notation that he had a normal myocardial perfusion study [place] in 2014. He has a history of stable ascending aortic aneurysm 47 mm on recent CTA 12/02/2020 and emphysema.  Echocardiogram performed at that time showed an EF of 60 to 65% impaired relaxation with indeterminate filling pressure mild right ventricular enlargement with normal function mild aortic regurgitation mild aortic stenosis peak and mean gradient of 21 and 13 mmHg.  The patient {does/does not:200015} have symptoms concerning for COVID-19 infection (fever, chills, cough, or new shortness of breath).    Past Medical History:  Diagnosis Date  . Abnormal myocardial perfusion study 05/08/2015  . Acid reflux disease 05/27/2017  . Acquired leg length discrepancy 06/05/2016  . Acquired valgus deformity of left ankle 06/05/2016  . Allergic rhinitis   . Aortic valve disorder    "it's enlarged; works fine" (03/30/2018)  . Arthritis    "knuckes, right knee"  9(03/30/2018)  . Ascending aortic aneurysm ([place]) 05/08/2015   Overview:  43 mm on CTA  . Back pain, chronic 07/26/2018  . Benign prostate hyperplasia 07/15/2017  . BPH (benign prostatic hypertrophy)   . Chest pain   . Chest pain on exertion 05/08/2015  . Chronic pain of both knees 11/17/2017  . Chronic pain of left ankle 07/26/2018  . COPD (chronic obstructive pulmonary disease) ([place])   . Deformity of left lower extremity 05/14/2016  . Deformity of lower extremity 07/28/2018  . Dyspnea   . Foot callus 05/14/2016  . GERD (gastroesophageal reflux disease)   . Hammertoe of second toe of left foot 12/21/2017  . Headache    "used to have them alot; went away mostly after I quit smoking" (03/30/2018)  . Heart murmur   . History of blood transfusion 1949   "related to farm tractor accident"  . History of kidney stones    MORE THAN 10 YRS AGO  . Hypertension   . Hypothyroidism   . Hypothyroidism   . 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 Irregular heart beat 07/28/2018  . Nail dystrophy 05/14/2016  . Neuropathy 08/14/2017  . Obstructive emphysema ([place]) 05/08/2015  . Osteoarthritis 05/27/2017  . Pneumonia    "once" (03/30/2018)  . PONV (postoperative nausea and vomiting)   . Premature ventricular contractions 05/27/2017  . Restless leg syndrome 07/15/2017  . S/P TKR (total knee replacement) using cement, right 04/06/2018  . S/P total knee replacement 03/29/2018  . Sleep apnea    DID NOT GO BACK FOR 2ND TEST.  NO MACHINE   Past Surgical History:  Procedure Laterality Date  . ANKLE SURGERY Left 1949   "DEFORMED"  D/T TRACTOR ACCIDENT  . BACK SURGERY    . FIXATION KYPHOPLASTY LUMBAR SPINE  2013  . FRACTURE SURGERY    . JOINT REPLACEMENT    . TONSILLECTOMY    . TOTAL KNEE ARTHROPLASTY Right 03/29/2018   Procedure: RIGHT TOTAL KNEE ARTHROPLASTY;  Surgeon: Vickey Huger, MD;  Location: [facility];  Service: Orthopedics;  Laterality: Right;     No outpatient medications have been  marked as taking for the 01/01/21 encounter (Appointment) with Richardo Priest, MD.     Allergies:   Patient has no known allergies.   Social History   Tobacco Use  . Smoking status: Former Smoker    Packs/day: 1.00    Years: 25.00    Pack years: 25.00    Types: Cigarettes    Quit date: 12/16/1987    Years since quitting: 33.0  . Smokeless tobacco: Never Used  Vaping Use  . Vaping Use: Never used  Substance Use Topics  . Alcohol use: Yes    Alcohol/week: 4.0 standard drinks    Types: 4 Cans of beer per week  . Drug use: No     Family Hx: The patient's family history includes Cancer in his father and sister; Early death in his father; Heart disease in his father, maternal grandfather, and maternal grandmother; Hyperlipidemia in his mother; Hypertension in his mother; Kidney disease in his brother and maternal grandmother; Leukemia in his mother; Stroke in his mother.  ROS:   Please see the history of present illness.    *** All other systems reviewed and are negative.   Prior CV studies:   The following studies were reviewed today:  ***  Labs/Other Tests and Data Reviewed:    EKG:  {EKG/Telemetry Strips Reviewed:[phone]}  Recent Labs: No results found for requested labs within last 8760 hours.   Recent Lipid Panel No results found for: CHOL, TRIG, HDL, CHOLHDL, LDLCALC, LDLDIRECT  Wt Readings from Last 3 Encounters:  10/17/20 203 lb (92.1 kg)  07/28/18 205 lb (93 kg)  03/29/18 204 lb 9.6 oz (92.8 kg)     Risk Assessment/Calculations:   {Does this patient have ATRIAL FIBRILLATION?:[phone]}  Objective:    Vital Signs:  There were no vitals taken for this visit.   {[facility] Virtual Exam (Optional):[phone]::"VITAL SIGNS:  reviewed"}  ASSESSMENT & PLAN:    1. ***   {Are you ordering a CV Procedure (e.g. stress test, cath, DCCV, TEE, etc)?   Press F2        :[id number]}    COVID-19 Education: The signs and symptoms of COVID-19 were discussed  with the patient and how to seek care for testing (follow up with PCP or arrange E-visit).  ***The importance of social distancing was discussed today.  Time:   Today, I have spent *** minutes with the patient with telehealth technology discussing the above problems.     Medication Adjustments/Labs and Tests Ordered: Current medicines are reviewed at length with the patient today.  Concerns regarding medicines are outlined above.   Tests Ordered: No orders of the defined types were placed in this encounter.   Medication Changes: No orders of the defined types were placed in this encounter.   Follow Up:  {F/U Format:[phone]} {follow up:15908}  Signed, Shirlee More, MD  12/31/2020 10:46 AM    [place]

## 2021-01-01 ENCOUNTER — Ambulatory Visit: Payer: Self-pay | Admitting: Cardiology

## 2021-02-26 IMAGING — CT CT ANGIO CHEST
2 of 6 series · 13 of 36 positions shown · IV contrast (iopamidol)
Comparison: July 28, 2018

CLINICAL DATA: DALI follow-up

EXAM:
CT ANGIOGRAPHY CHEST WITH CONTRAST
TECHNIQUE: Multidetector CT imaging of the chest was performed using the
standard protocol during bolus administration of intravenous
contrast. Multiplanar CT image reconstructions and MIPs were
obtained to evaluate the vascular anatomy.
CONTRAST:  75mL 8A07FD-O7Q IOPAMIDOL (8A07FD-O7Q) INJECTION 76%

[Series 6: cta thorax 2.00 bv36 s3 axial arterial · axial · arterial · 0.69mm/px · z∈[+1531,+1799]mm · 12 of 160 slices shown]
[im 13/160  lung]
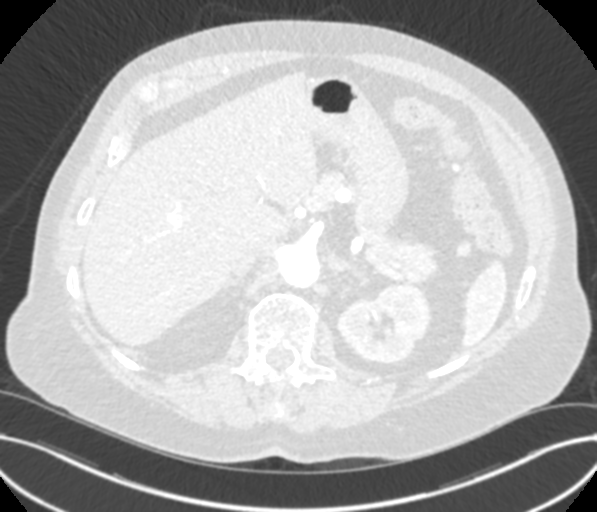
[im 25/160  mediastinal]
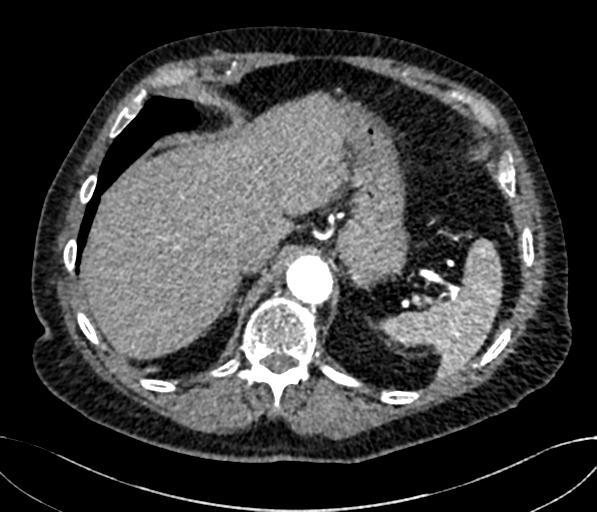
[im 37/160  lung]
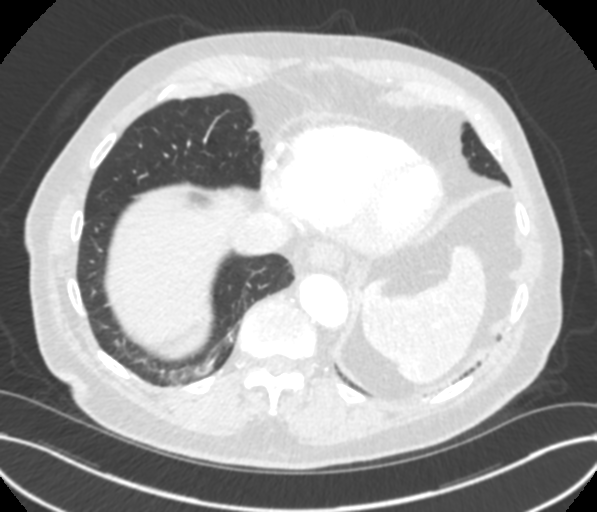
[im 49/160  mediastinal]
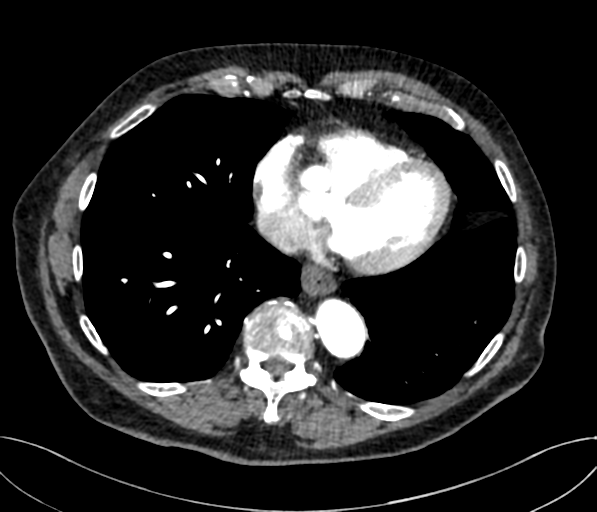
[im 62/160  lung]
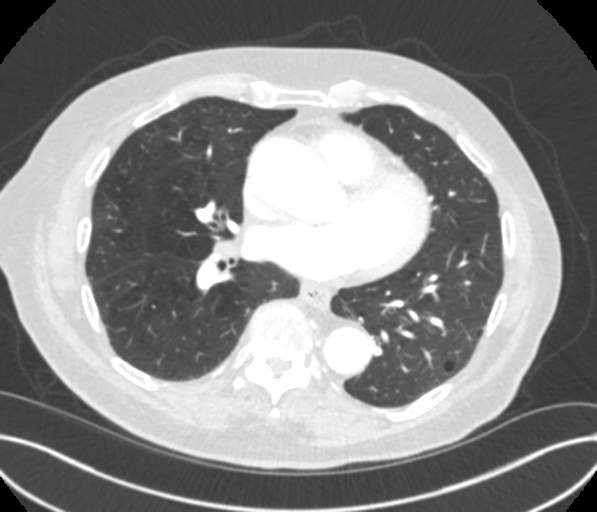
[im 74/160  mediastinal]
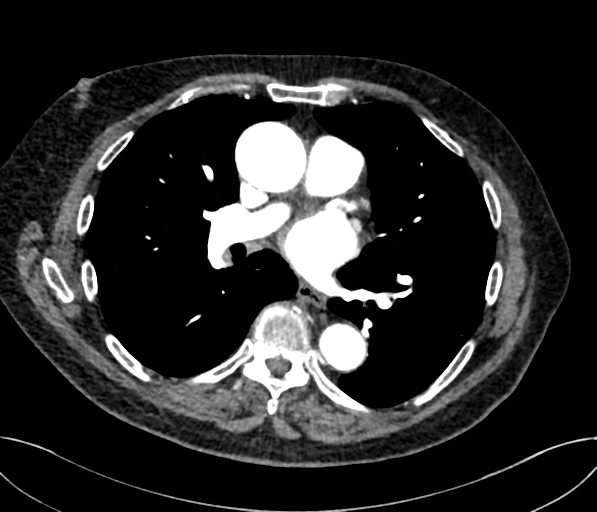
[im 86/160  lung]
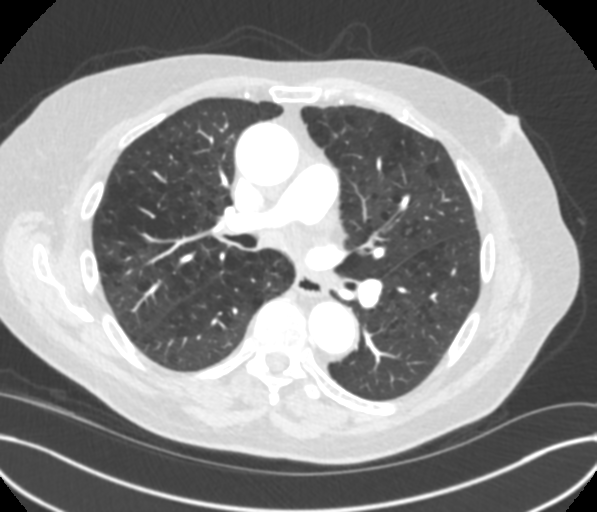
[im 98/160  mediastinal]
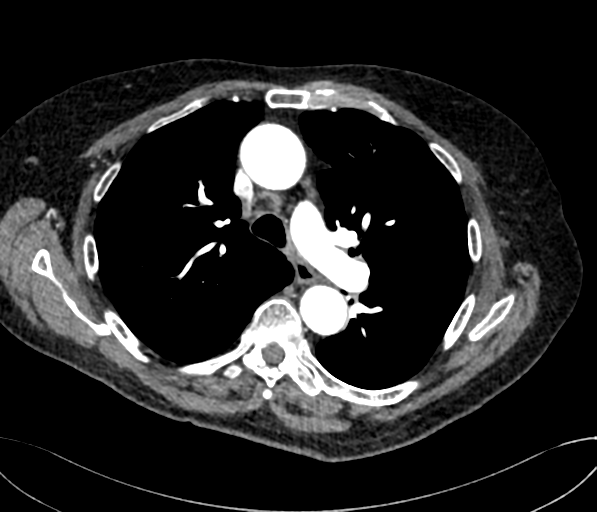
[im 111/160  lung]
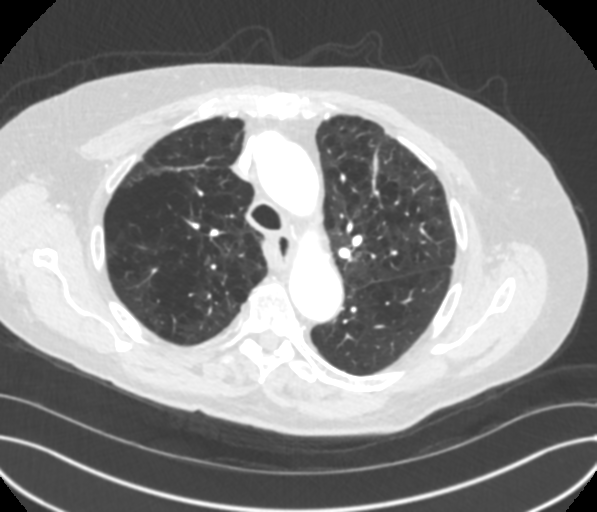
[im 123/160  mediastinal]
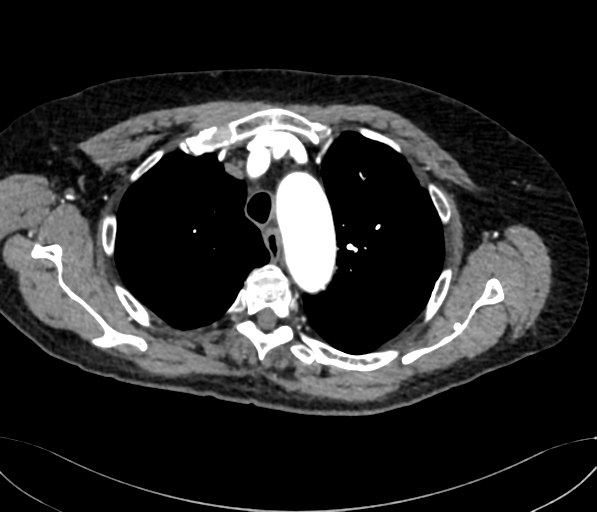
[im 135/160  lung]
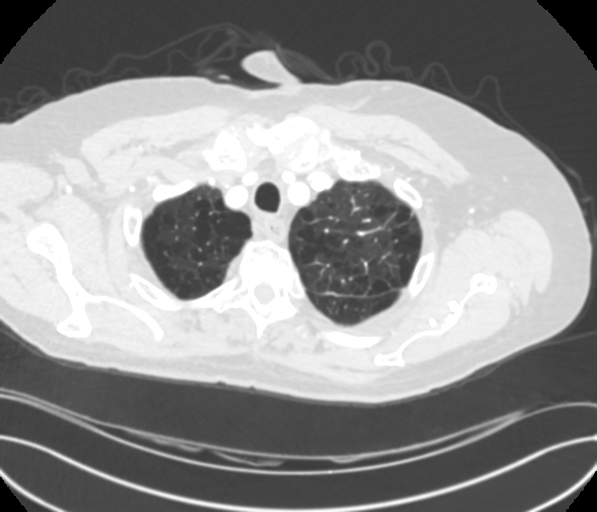
[im 147/160  mediastinal]
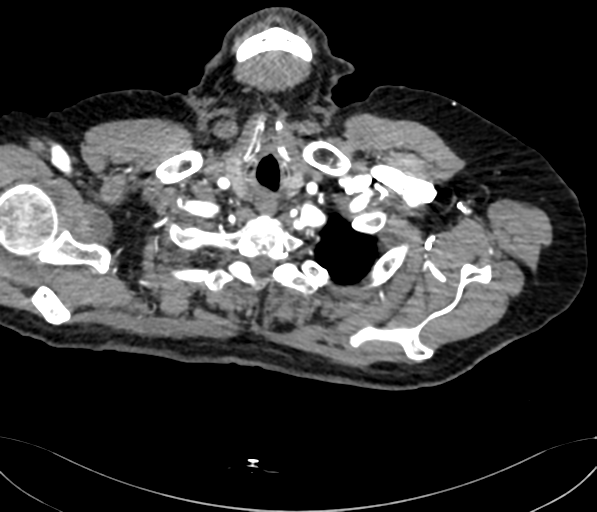

[Series 11: cta thorax 2.00 bv36 s3 cor st · coronal · 0.63mm/px · 1 of 176 slices shown]
[im 88/176  mediastinal]
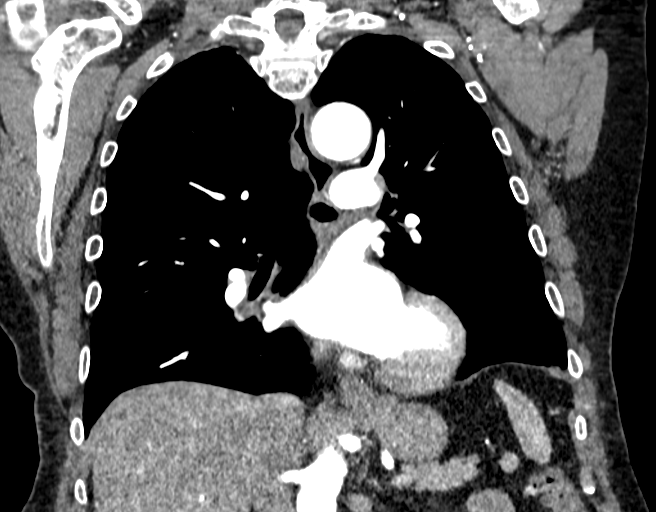

[13 of 36 positions shown; findings below may reference images not displayed]

FINDINGS: Cardiovascular: Revisualization of a 4.6 cm ascending thoracic
aortic aneurysm. No new dissection or intramural hematoma. There is
otherwise unchanged course and caliber of the visualized aorta.
Calcifications of the aortic valve leaflets. Predominately
LEFT-sided atherosclerotic calcifications of the coronary arteries.
Mild scattered atherosclerotic calcifications of the aorta. No
central pulmonary embolism.

Mediastinum/Nodes: Unchanged prominent lymph node of the AP window
measuring 12 mm in the short axis (series 6, image 54). No axillary
adenopathy. Thyroid is unremarkable.

Lungs/Pleura: No pleural effusion or pneumothorax. Mild bronchial
wall thickening. Severe apical predominant emphysematous changes.
Biapical scarring, unchanged. Bibasilar atelectasis. Revisualization
of scattered pulmonary nodules with representative nodules as
follows:

Nodule 1: LEFT lower lobe pulmonary nodule measures 6 x 4 mm,
unchanged (series 6, image 87).

Nodule 2: RIGHT apical pulmonary nodule measures 3 mm, unchanged
(series 6, image 34).

Upper Abdomen: RIGHT renal cyst. Diverticulosis. No acute finding in
the upper abdomen.

Musculoskeletal: Chronic wedging of several thoracic vertebral
bodies, unchanged.

Review of the MIP images confirms the above findings.
IMPRESSION: 1. Stable 4.6 cm ascending thoracic aortic aneurysm. Ascending
thoracic aortic aneurysm. Recommend semi-annual imaging followup by
CTA or MRA and referral to cardiothoracic surgery if not already
obtained. This recommendation follows 4292
ACCF/AHA/AATS/ACR/ASA/SCA/GREGORY/AKHMA/YUNADA/SAHRO Guidelines for the
Diagnosis and Management of Patients With Thoracic Aortic Disease.
Circulation. 4292; 121: E266-e369. Aortic aneurysm NOS (XAPD4-SRC.N)
2. Revisualization of scattered pulmonary nodules measuring up to 6
mm in the LEFT lower lobe, unchanged and consistent with a benign
etiology.

Aortic Atherosclerosis (XAPD4-6EV.V) and Emphysema (XAPD4-0M4.C).

## 2022-03-15 DEATH — deceased

## 2022-04-09 ENCOUNTER — Other Ambulatory Visit: Payer: Self-pay | Admitting: Cardiothoracic Surgery

## 2022-04-09 DIAGNOSIS — I7121 Aneurysm of the ascending aorta, without rupture: Secondary | ICD-10-CM

## 2022-05-05 ENCOUNTER — Other Ambulatory Visit: Payer: Medicare HMO

## 2022-05-05 ENCOUNTER — Ambulatory Visit: Payer: Medicare HMO | Admitting: Cardiothoracic Surgery
